# Patient Record
Sex: Male | Born: 1950 | Race: Black or African American | Hispanic: No | Marital: Single | Smoking: Current every day smoker
Health system: Southern US, Community
[De-identification: ages and names within clinical notes are randomized; demographics above are authoritative.]

## PROBLEM LIST (undated history)

## (undated) DIAGNOSIS — I10 Essential (primary) hypertension: Secondary | ICD-10-CM

---

## 2016-02-26 ENCOUNTER — Ambulatory Visit: Payer: Self-pay | Admitting: Family Medicine

## 2018-07-08 ENCOUNTER — Emergency Department (HOSPITAL_BASED_OUTPATIENT_CLINIC_OR_DEPARTMENT_OTHER): Payer: Medicare Other

## 2018-07-08 ENCOUNTER — Encounter (HOSPITAL_BASED_OUTPATIENT_CLINIC_OR_DEPARTMENT_OTHER): Payer: Self-pay | Admitting: Emergency Medicine

## 2018-07-08 ENCOUNTER — Other Ambulatory Visit: Payer: Self-pay

## 2018-07-08 ENCOUNTER — Emergency Department (HOSPITAL_BASED_OUTPATIENT_CLINIC_OR_DEPARTMENT_OTHER)
Admission: EM | Admit: 2018-07-08 | Discharge: 2018-07-08 | Disposition: A | Discharge status: Home / Self Care | Payer: Medicare Other | Attending: Emergency Medicine | Admitting: Emergency Medicine

## 2018-07-08 DIAGNOSIS — I10 Essential (primary) hypertension: Secondary | ICD-10-CM | POA: Insufficient documentation

## 2018-07-08 DIAGNOSIS — F17200 Nicotine dependence, unspecified, uncomplicated: Secondary | ICD-10-CM | POA: Diagnosis not present

## 2018-07-08 DIAGNOSIS — M79631 Pain in right forearm: Secondary | ICD-10-CM | POA: Insufficient documentation

## 2018-07-08 DIAGNOSIS — Z79899 Other long term (current) drug therapy: Secondary | ICD-10-CM | POA: Insufficient documentation

## 2018-07-08 DIAGNOSIS — M25531 Pain in right wrist: Secondary | ICD-10-CM

## 2018-07-08 HISTORY — DX: Essential (primary) hypertension: I10

## 2018-07-08 MED ORDER — NAPROXEN 500 MG PO TABS: 500.0000 mg | ORAL_TABLET | Freq: Two times a day (BID) | ORAL | 0 refills | Status: DC

## 2018-07-08 MED ORDER — HYDROCODONE-ACETAMINOPHEN 5-325 MG PO TABS
2.0000 | ORAL_TABLET | Freq: Once | ORAL | Status: AC
  Administered 2018-07-08: 2 via ORAL
  Filled 2018-07-08: qty 2

## 2018-07-08 MED ORDER — ACETAMINOPHEN 500 MG PO TABS: 500.0000 mg | ORAL_TABLET | Freq: Four times a day (QID) | ORAL | 0 refills | Status: DC | PRN

## 2018-07-08 NOTE — ED Notes (Signed)
Patient transported to X-ray 

## 2018-07-08 NOTE — Discharge Instructions (Signed)
Take Naprosyn and Tylenol as prescribed for your pain.  Do not combine Naprosyn and ibuprofen, however if you like ibuprofen better, you can replace it with Naprosyn.  Use ice 3-4 times daily alternating 20 minutes on, 20 minutes off.  Wear your wrist splint at all times except when bathing.  Try to avoid using your wrist and rest it.  Please follow-up with the sports medicine doctor, Dr. Pearletha Forge, for further evaluation and treatment of your wrist pain.  Please return the emergency department if you develop any new or worsening symptoms including extreme redness, warmth, or fever.  Please have your doctor recheck your blood pressure and continue taking her blood pressure medications, as it was elevated today, most likely related to your pain.

## 2018-07-08 NOTE — ED Provider Notes (Signed)
[facility] EMERGENCY DEPARTMENT Provider Note   CSN: [id number] Arrival date & time: 07/08/18  1629     History   Chief Complaint Chief Complaint  Patient presents with  . Arm Pain    HPI Richard Buck is a 67 y.o. male with history of hypertension who presents with right wrist and forearm pain since yesterday.  Patient denies any trauma or injury.  However, patient is a Investment banker, operational and uses repetitive motions, especially in his right hand. He is right-handed.  He applied a shea butter, CBD oil salve at home as well as taking Tylenol and ibuprofen without relief.  He denies any numbness or tingling.  Patient denies any fevers.  He has no history of gout.  He denies symptoms like this in the past. No history of IVDU.  HPI  Past Medical History:  Diagnosis Date  . Hypertension     There are no active problems to display for this patient.   History reviewed. No pertinent surgical history.      Home Medications    Prior to Admission medications   Medication Sig Start Date End Date Taking? Authorizing Provider  amLODipine (NORVASC) 10 MG tablet Take 10 mg by mouth daily.   Yes [provider]  metoprolol tartrate (LOPRESSOR) 50 MG tablet Take 50 mg by mouth 2 (two) times daily.   Yes [provider]  acetaminophen (TYLENOL) 500 MG tablet Take 1 tablet (500 mg total) by mouth every 6 (six) hours as needed. 07/08/18   Felicha Frayne, Waylan Boga, PA-C  naproxen (NAPROSYN) 500 MG tablet Take 1 tablet (500 mg total) by mouth 2 (two) times daily. 07/08/18   Emi Holes, PA-C    Family History No family history on file.  Social History Social History   Tobacco Use  . Smoking status: Current Every Day Smoker  . Smokeless tobacco: Never Used  Substance Use Topics  . Alcohol use: Not Currently  . Drug use: Yes    Types: Marijuana     Allergies   Patient has no known allergies.   Review of Systems Review of Systems  Constitutional: Negative for fever.    Musculoskeletal: Positive for arthralgias, joint swelling and myalgias.  Neurological: Negative for numbness.     Physical Exam Updated Vital Signs BP (!) 171/93 (BP Location: Left Arm)   Pulse 64   Temp 98.5 F (36.9 C) (Oral)   Resp 18   Ht 6\' 4"  (1.93 m)   Wt 127 kg   SpO2 96%   BMI 34.08 kg/m   Physical Exam  Constitutional: He appears well-developed and well-nourished. No distress.  HENT:  Head: Normocephalic and atraumatic.  Mouth/Throat: Oropharynx is clear and moist. No oropharyngeal exudate.  Eyes: Pupils are equal, round, and reactive to light. Conjunctivae are normal. Right eye exhibits no discharge. Left eye exhibits no discharge. No scleral icterus.  Neck: Normal range of motion. Neck supple. No thyromegaly present.  Cardiovascular: Normal rate, regular rhythm, normal heart sounds and intact distal pulses. Exam reveals no gallop and no friction rub.  No murmur heard. Pulmonary/Chest: Effort normal and breath sounds normal. No stridor. No respiratory distress. He has no wheezes. He has no rales.  Musculoskeletal: He exhibits no edema.  Mild edema noted to right wrist with tenderness to the radial aspect and anatomical snuffbox; as well as tenderness of the forearm in the ulnar aspect; pain with any range of motion of the wrist and pain in the wrist with flexion and  extension of the fingers; no warmth erythema noted to the wrist; no elbow or shoulder tenderness  Lymphadenopathy:    He has no cervical adenopathy.  Neurological: He is alert. Coordination normal.  Skin: Skin is warm and dry. No rash noted. He is not diaphoretic. No pallor.  Psychiatric: He has a normal mood and affect.  Nursing note and vitals reviewed.    ED Treatments / Results  Labs (all labs ordered are listed, but only abnormal results are displayed) Labs Reviewed - No data to display  EKG None  Radiology Dg Forearm Right  Result Date: 07/08/2018 CLINICAL DATA:  Acute onset of RIGHT  wrist pain and RIGHT forearm pain that began yesterday. No known injuries. EXAM: RIGHT FOREARM - 2 VIEW COMPARISON:  RIGHT wrist x-rays obtained concurrently. FINDINGS: No evidence of acute fracture involving the radius or ulna. No intrinsic osseous abnormality. Visualized elbow joint intact. IMPRESSION: Normal examination. Electronically Signed   By: Hulan Saas M.D.   On: 07/08/2018 17:35   Dg Wrist Complete Right  Result Date: 07/08/2018 CLINICAL DATA:  Acute onset of pain involving the RIGHT wrist and RIGHT forearm that began yesterday. No known injury. EXAM: RIGHT WRIST - COMPLETE 3+ VIEW COMPARISON:  None. FINDINGS: Mild dorsal soft tissue swelling. No evidence of acute fracture or dislocation. Joint spaces well preserved. Well-preserved bone mineral density. No intrinsic osseous abnormalities. IMPRESSION: No osseous abnormality. Electronically Signed   By: Hulan Saas M.D.   On: 07/08/2018 17:35    Procedures Procedures (including critical care time)  Medications Ordered in ED Medications  HYDROcodone-acetaminophen (NORCO/VICODIN) 5-325 MG per tablet 2 tablet (2 tablets Oral Given 07/08/18 1649)     Initial Impression / Assessment and Plan / ED Course  I have reviewed the triage vital signs and the nursing notes.  Pertinent labs & imaging results that were available during my care of the patient were reviewed by me and considered in my medical decision making (see chart for details).     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  Low suspicion of septic joint, no warmth, erythema, or fever.  X-ray of the right forearm and wrist are negative.  We will treat with NSAIDs, Tylenol, ice, and thumb spica splint.  Patient will be advised to follow-up with sports medicine for further evaluation and treatment.  Return precautions discussed.  Patient understands and agrees with plan.  Patient vitals stable throughout ED course and discharged in  satisfactory condition.  I discussed patient case with Dr. Rush Landmark who guided the patient's management and agrees with plan.   Final Clinical Impressions(s) / ED Diagnoses   Final diagnoses:  Right wrist pain    ED Discharge Orders         Ordered    naproxen (NAPROSYN) 500 MG tablet  2 times daily     07/08/18 1801    acetaminophen (TYLENOL) 500 MG tablet  Every 6 hours PRN     07/08/18 1801           Emi Holes, PA-C 07/08/18 1804    Tegeler, Canary Brim, MD 07/09/18 0040

## 2018-07-08 NOTE — ED Notes (Signed)
ED Provider at bedside. 

## 2018-07-08 NOTE — ED Triage Notes (Signed)
R arm pain from the wrist to the elbow since yesterday. Denies injury.

## 2019-02-13 ENCOUNTER — Encounter: Payer: Self-pay | Admitting: Gastroenterology

## 2019-04-08 ENCOUNTER — Encounter: Payer: Self-pay | Admitting: Gastroenterology

## 2019-04-25 ENCOUNTER — Ambulatory Visit: Payer: Medicare Other

## 2019-07-16 ENCOUNTER — Ambulatory Visit: Payer: Medicare Other

## 2019-07-23 ENCOUNTER — Other Ambulatory Visit: Payer: Self-pay

## 2019-07-23 ENCOUNTER — Ambulatory Visit (INDEPENDENT_AMBULATORY_CARE_PROVIDER_SITE_OTHER): Payer: Self-pay | Admitting: *Deleted

## 2019-07-23 DIAGNOSIS — Z1211 Encounter for screening for malignant neoplasm of colon: Secondary | ICD-10-CM

## 2019-07-23 MED ORDER — NA SULFATE-K SULFATE-MG SULF 17.5-3.13-1.6 GM/177ML PO SOLN: 1.0000 | Freq: Once | ORAL | 0 refills | Status: AC

## 2019-07-23 NOTE — Progress Notes (Addendum)
Gastroenterology Pre-Procedure Review  Request Date: 07/23/2019 Requesting Physician: Ferdinand Lango, NP @ [facility], no previous TCS  PATIENT REVIEW QUESTIONS: The patient responded to the following health history questions as indicated:    1. Diabetes Melitis: No 2. Joint replacements in the past 12 months: No 3. Major health problems in the past 3 months: No 4. Has an artificial valve or MVP: No 5. Has a defibrillator: No 6. Has been advised in past to take antibiotics in advance of a procedure like teeth cleaning: No 7. Family history of colon cancer: No  8. Alcohol Use: No 9. History of sleep apnea: No  10. History of coronary artery or other vascular stents placed within the last 12 months: No 11. History of any prior anesthesia complications: No    MEDICATIONS & ALLERGIES:    Patient reports the following regarding taking any blood thinners:   Plavix? No Aspirin? No Coumadin? No Brilinta? No Xarelto? No Eliquis? No Pradaxa? No Savaysa? No Effient? No  Patient confirms/reports the following medications:  Current Outpatient Medications  Medication Sig Dispense Refill  . amLODipine (NORVASC) 10 MG tablet Take 10 mg by mouth daily.    . metoprolol tartrate (LOPRESSOR) 50 MG tablet Take 50 mg by mouth 2 (two) times daily.    . sildenafil (VIAGRA) 50 MG tablet Take 50 mg by mouth as needed for erectile dysfunction.     No current facility-administered medications for this visit.     Patient confirms/reports the following allergies:  No Known Allergies  No orders of the defined types were placed in this encounter.   AUTHORIZATION INFORMATION Primary Insurance: Ivery Quale,  ID #: [id number],  Group #: [id number] Pre-Cert / Josem Kaufmann required:  Pre-Cert / Auth #:  SCHEDULE INFORMATION: Procedure has been scheduled as follows:  Date: 09/20/2019, Time: 10:30 Location: [facility] with Dr. Oneida Alar  This Gastroenterology Pre-Precedure Review Form is being routed to the  following provider(s): Neil Crouch, PA-C

## 2019-07-23 NOTE — Patient Instructions (Signed)
Richard Buck  May 24, 1951 MRN: [id number]     Procedure Date: 09/20/2019 Time to register: 9:30 am Place to register: [facility] Procedure Time: 10:30 am Scheduled provider: Dr. Oneida Alar    PREPARATION FOR COLONOSCOPY WITH SUPREP BOWEL PREP KIT  Note: Suprep Bowel Prep Kit is a split-dose (2day) regimen. Consumption of BOTH 6-ounce bottles is required for a complete prep.  Please notify us immediately if you are diabetic, take iron supplements, or if you are on Coumadin or any other blood thinners.                                                                                                                                                 1 DAY BEFORE PROCEDURE:  DATE: 09/19/2019   DAY: Thursday Continue clear liquids the entire day - NO SOLID FOOD.    At 6:00pm: Complete steps 1 through 4 below, using ONE (1) 6-ounce bottle, before going to bed. Step 1:  Pour ONE (1) 6-ounce bottle of SUPREP liquid into the mixing container.  Step 2:  Add cool drinking water to the 16 ounce line on the container and mix.  Note: Dilute the solution concentrate as directed prior to use. Step 3:  DRINK ALL the liquid in the container. Step 4:  You MUST drink an additional two (2) or more 16 ounce containers of water over the next one (1) hour.   Continue clear liquids.  DAY OF PROCEDURE:   DATE: 09/20/2019   DAY: Friday If you take medications for your heart, blood pressure, or breathing, you may take these medications.   5 hours before your procedure at:  5:30 am Step 1:  Pour ONE (1) 6-ounce bottle of SUPREP liquid into the mixing container.  Step 2:  Add cool drinking water to the 16 ounce line on the container and mix.  Note: Dilute the solution concentrate as directed prior to use. Step 3:  DRINK ALL the liquid in the container. Step 4:  You MUST drink an additional two (2) or more 16 ounce containers of water over the next one (1) hour. You MUST complete the final glass of  water at least 3 hours before your colonoscopy. Nothing by mouth past 7:30 am  You may take your morning medications with sip of water unless we have instructed otherwise.    Please see below for Dietary Information.  CLEAR LIQUIDS INCLUDE:  Water Jello (NOT red in color)   Ice Popsicles (NOT red in color)   Tea (sugar ok, no milk/cream) Powdered fruit flavored drinks  Coffee (sugar ok, no milk/cream) Gatorade/ Lemonade/ Kool-Aid  (NOT red in color)   Juice: apple, white grape, white cranberry Soft drinks  Clear bullion, consomme, broth (fat free beef/chicken/vegetable)  Carbonated beverages (any kind)  Strained chicken noodle soup Hard Candy   Remember: Clear liquids  are liquids that will allow you to see your fingers on the other side of a clear glass. Be sure liquids are NOT red in color, and not cloudy, but CLEAR.  DO NOT EAT OR DRINK ANY OF THE FOLLOWING:  Dairy products of any kind   Cranberry juice Tomato juice / V8 juice   Grapefruit juice Orange juice     Red grape juice  Do not eat any solid foods, including such foods as: cereal, oatmeal, yogurt, fruits, vegetables, creamed soups, eggs, bread, crackers, pureed foods in a blender, etc.   HELPFUL HINTS FOR DRINKING PREP SOLUTION:   Make sure prep is extremely cold. Mix and refrigerate the the morning of the prep. You may also put in the freezer.   You may try mixing some Crystal Light or Country Time Lemonade if you prefer. Mix in small amounts; add more if necessary.  Try drinking through a straw  Rinse mouth with water or a mouthwash between glasses, to remove after-taste.  Try sipping on a cold beverage /ice/ popsicles between glasses of prep.  Place a piece of sugar-free hard candy in mouth between glasses.  If you become nauseated, try consuming smaller amounts, or stretch out the time between glasses. Stop for 30-60 minutes, then slowly start back drinking.     OTHER INSTRUCTIONS  You will need a  responsible adult at least 66 years of age to accompany you and drive you home. This person must remain in the waiting room during your procedure. The hospital will cancel your procedure if you do not have a responsible adult with you.   1. Wear loose fitting clothing that is easily removed. 2. Leave jewelry and other valuables at home.  3. Remove all body piercing jewelry and leave at home. 4. Total time from sign-in until discharge is approximately 2-3 hours. 5. You should go home directly after your procedure and rest. You can resume normal activities the day after your procedure. 6. The day of your procedure you should not:  Drive  Make legal decisions  Operate machinery  Drink alcohol  Return to work   You may call the office (Dept: [phone]) before 5:00pm, or page the doctor on call [phone]) after 5:00pm, for further instructions, if necessary.   Insurance Information YOU WILL NEED TO CHECK WITH YOUR INSURANCE COMPANY FOR THE BENEFITS OF COVERAGE YOU HAVE FOR THIS PROCEDURE.  UNFORTUNATELY, NOT ALL INSURANCE COMPANIES HAVE BENEFITS TO COVER ALL OR PART OF THESE TYPES OF PROCEDURES.  IT IS YOUR RESPONSIBILITY TO CHECK YOUR BENEFITS, HOWEVER, WE WILL BE GLAD TO ASSIST YOU WITH ANY CODES YOUR INSURANCE COMPANY MAY NEED.    PLEASE NOTE THAT MOST INSURANCE COMPANIES WILL NOT COVER A SCREENING COLONOSCOPY FOR PEOPLE UNDER THE AGE OF 50  IF YOU HAVE BCBS INSURANCE, YOU MAY HAVE BENEFITS FOR A SCREENING COLONOSCOPY BUT IF POLYPS ARE FOUND THE DIAGNOSIS WILL CHANGE AND THEN YOU MAY HAVE A DEDUCTIBLE THAT WILL NEED TO BE MET. SO PLEASE MAKE SURE YOU CHECK YOUR BENEFITS FOR A SCREENING COLONOSCOPY AS WELL AS A DIAGNOSTIC COLONOSCOPY.

## 2019-07-23 NOTE — Progress Notes (Signed)
okk to schedule

## 2019-07-24 NOTE — Addendum Note (Signed)
Addended by: Metro Kung on: 07/24/2019 07:39 AM   Modules accepted: Orders, SmartSet

## 2019-07-29 NOTE — Progress Notes (Signed)
Called pt and informed him that [employer] told me that we were out of network and that his procedure would be an out of network expense.  Pt said not to cancel his procedure yet because he is going to contact [facility].  Pt to call us back.

## 2019-07-30 ENCOUNTER — Telehealth: Payer: Self-pay | Admitting: *Deleted

## 2019-07-30 NOTE — Telephone Encounter (Addendum)
Spoke with pt today and he requested [place] to cancel his procedure.  He is going to try to get another insurance.  His current insurance is out of network per [employer] at [place].  Pt will call us back in the future. Endo notified.

## 2019-07-31 NOTE — Telephone Encounter (Signed)
noted 

## 2019-09-18 ENCOUNTER — Other Ambulatory Visit (HOSPITAL_COMMUNITY): Payer: Medicare Other

## 2019-09-20 ENCOUNTER — Encounter (HOSPITAL_COMMUNITY): Payer: Self-pay

## 2019-09-20 ENCOUNTER — Ambulatory Visit (HOSPITAL_COMMUNITY): Admit: 2019-09-20 | Payer: Medicare Other | Admitting: Gastroenterology

## 2019-09-20 SURGERY — COLONOSCOPY
Anesthesia: Moderate Sedation

## 2019-09-30 IMAGING — DX DG WRIST COMPLETE 3+V*R*
4 series · 4 of 4 positions shown · non-contrast
Comparison: None.

CLINICAL DATA: Acute onset of pain involving the RIGHT wrist and
RIGHT forearm that began yesterday. No known injury.

EXAM:
RIGHT WRIST - COMPLETE 3+ VIEW

[wrist pa]
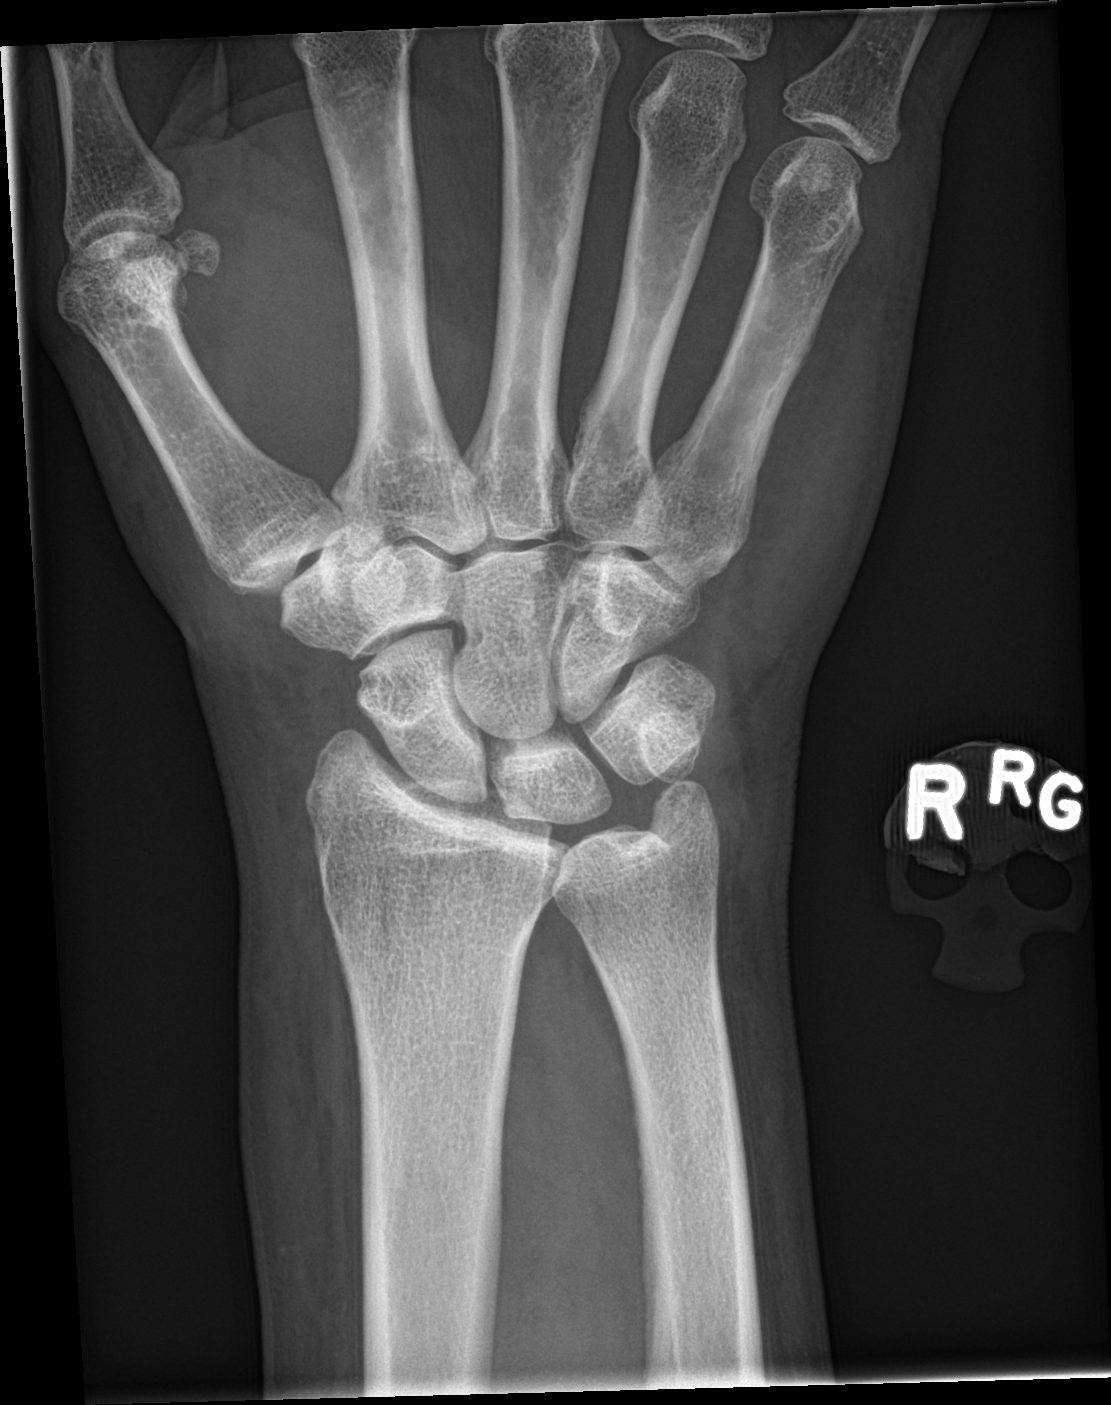

[wrist obl]
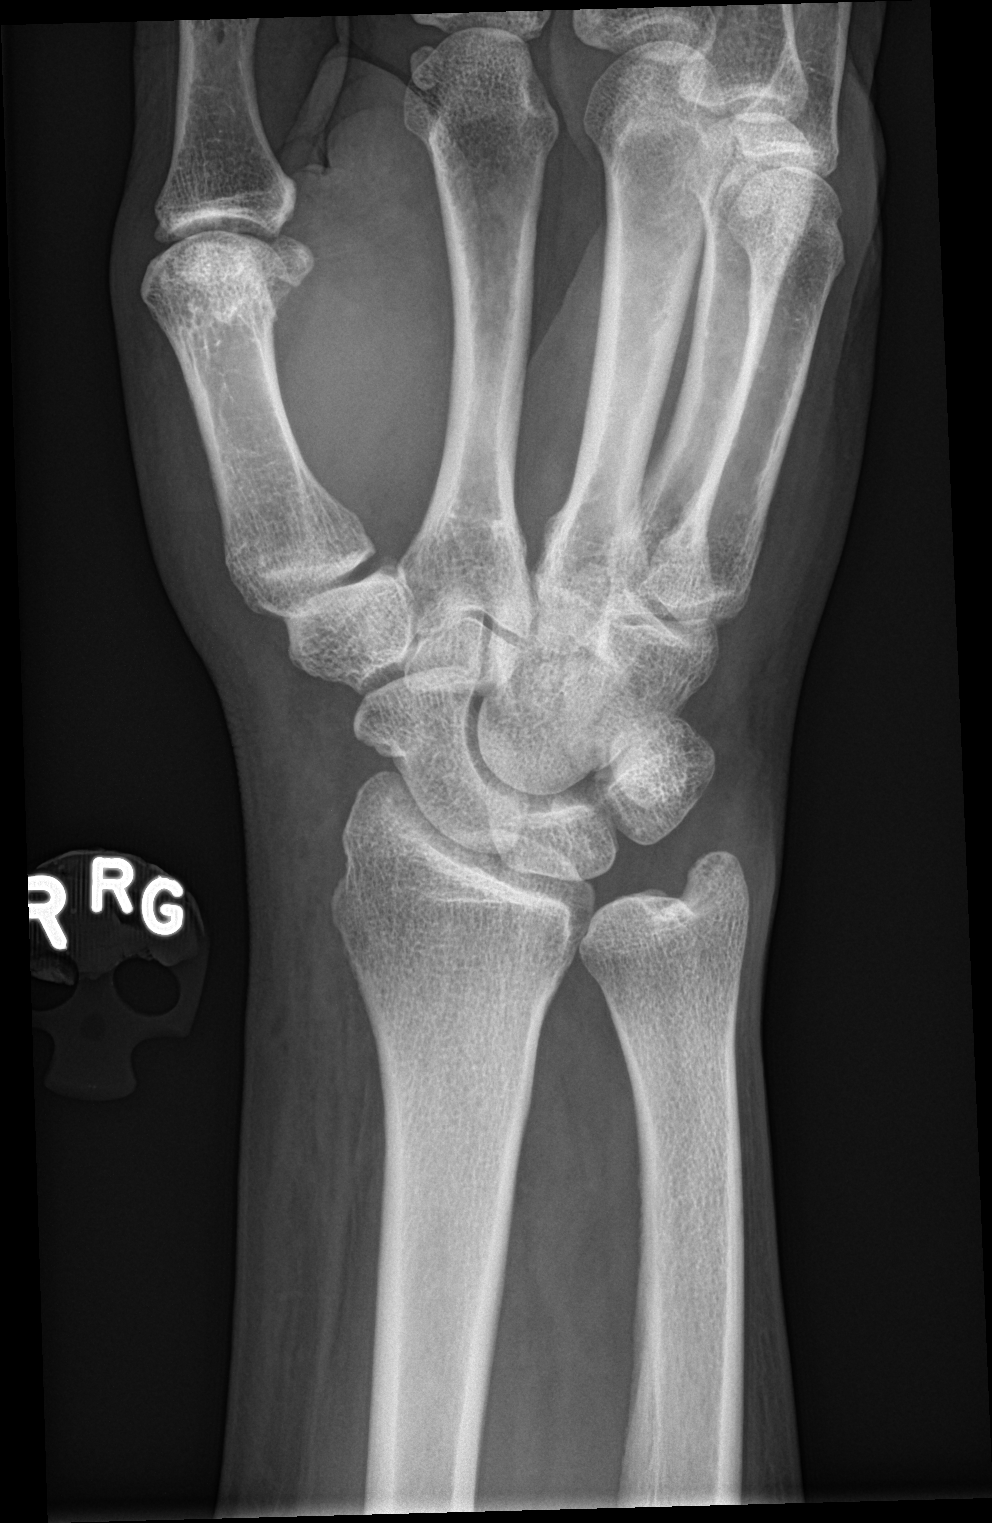

[wrist lat]
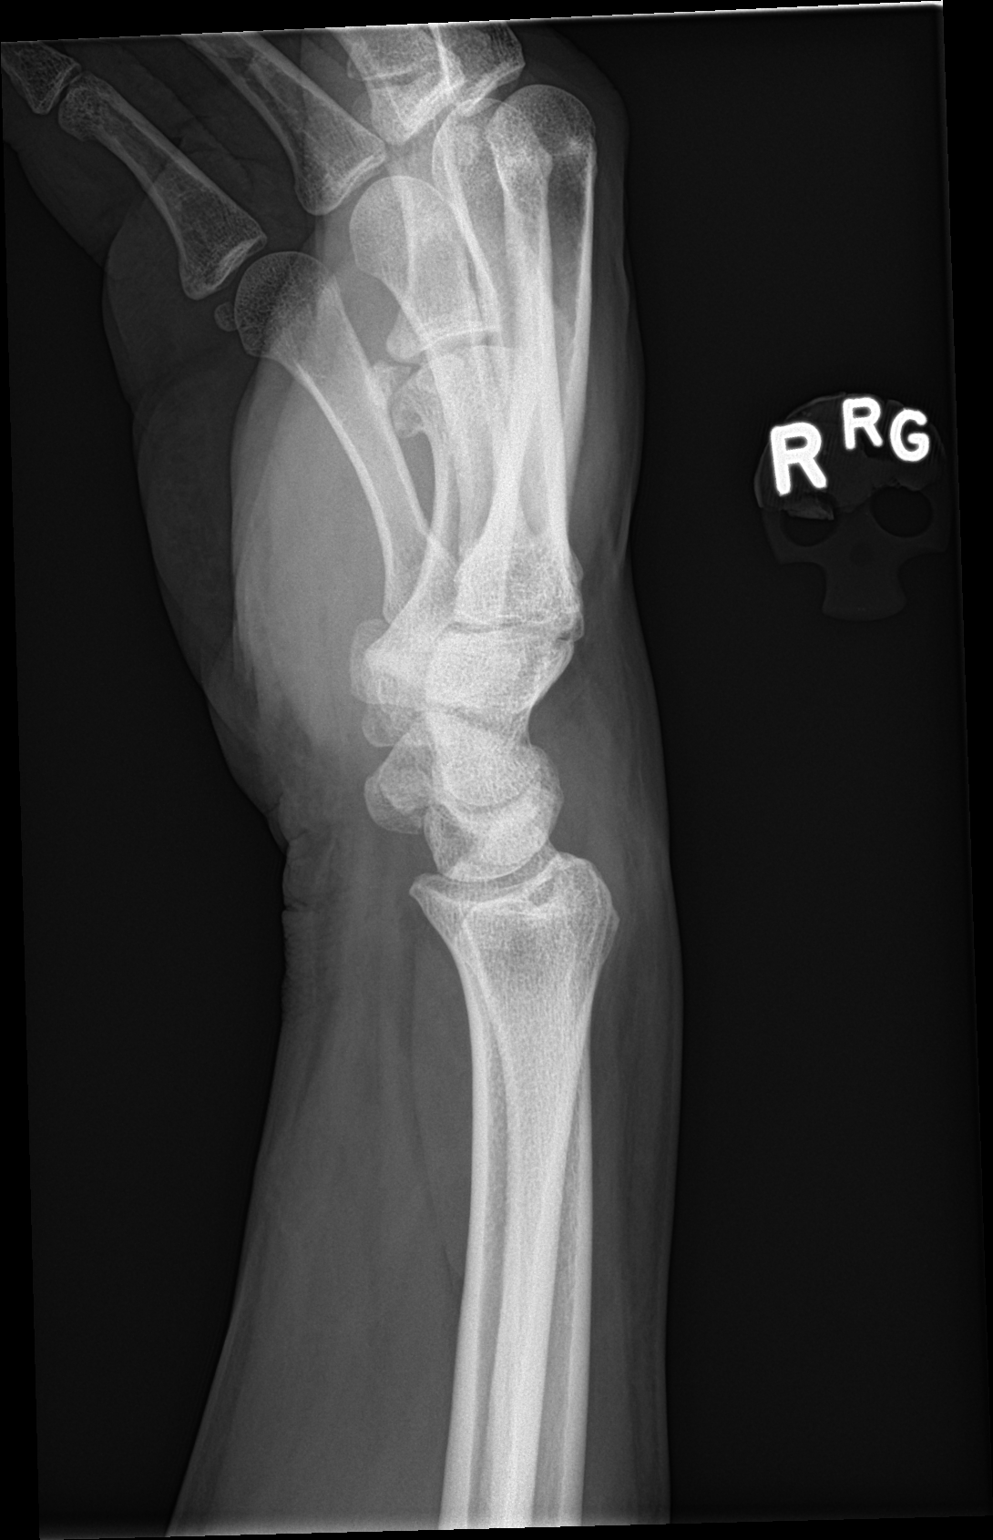

[wrist navicular]
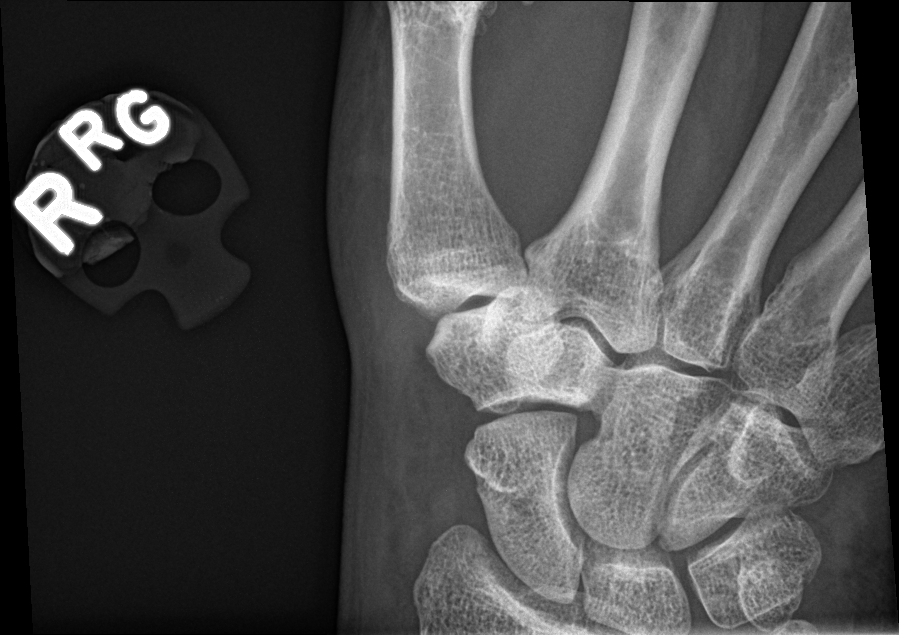

[4 of 4 positions shown; findings below may reference images not displayed]

FINDINGS: Mild dorsal soft tissue swelling. No evidence of acute fracture or
dislocation. Joint spaces well preserved. Well-preserved bone
mineral density. No intrinsic osseous abnormalities.
IMPRESSION: No osseous abnormality.

## 2019-09-30 IMAGING — DX DG FOREARM 2V*R*
2 series · 2 of 2 positions shown · non-contrast
Comparison: RIGHT wrist x-rays obtained concurrently.

CLINICAL DATA: Acute onset of RIGHT wrist pain and RIGHT forearm
pain that began yesterday. No known injuries.

EXAM:
RIGHT FOREARM - 2 VIEW

[forearm ap]
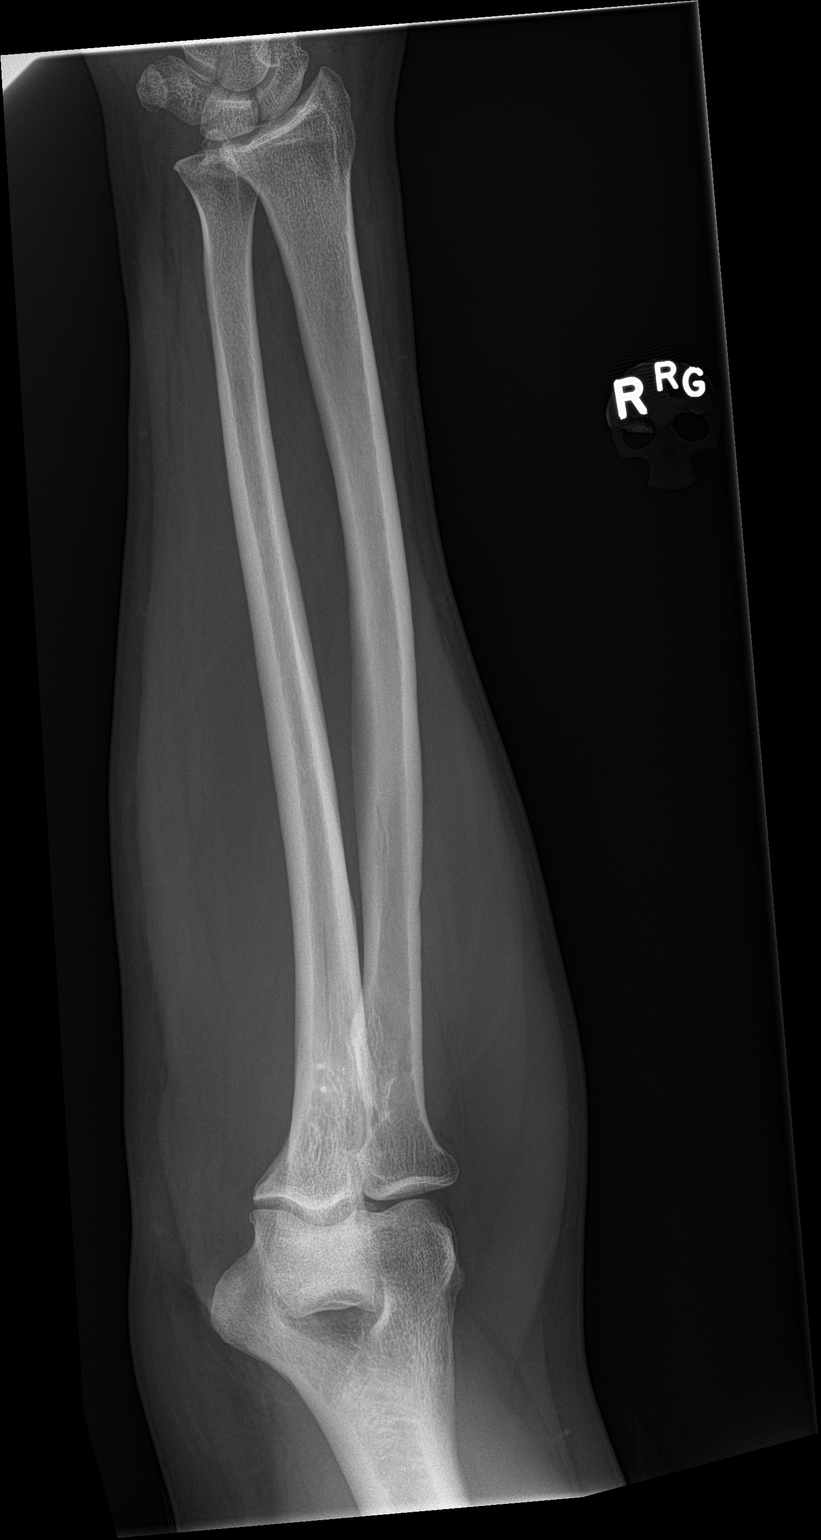

[forearm lat]
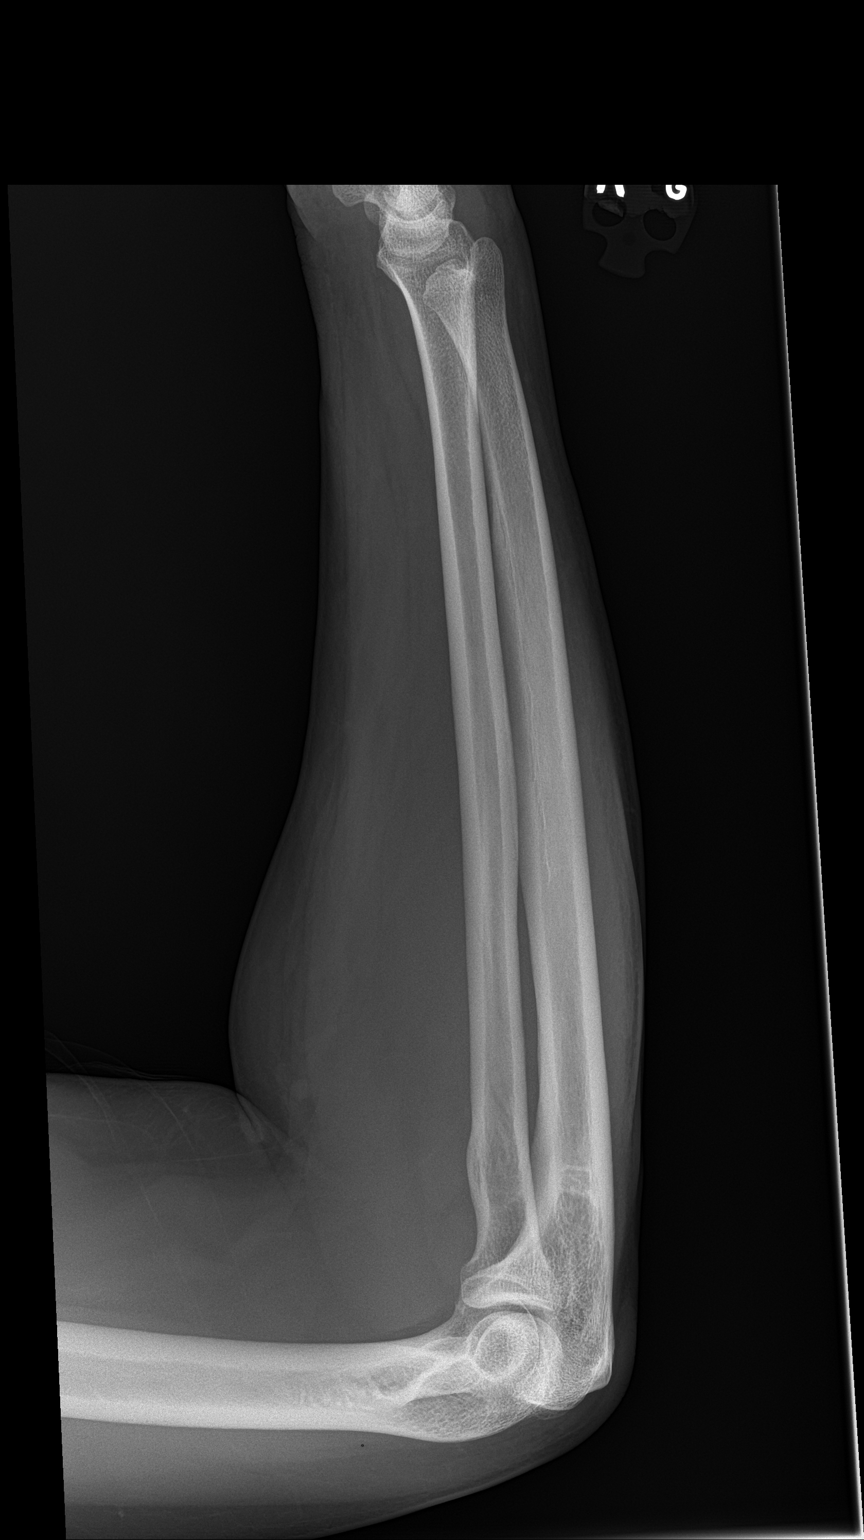

[2 of 2 positions shown; findings below may reference images not displayed]

FINDINGS: No evidence of acute fracture involving the radius or ulna. No
intrinsic osseous abnormality. Visualized elbow joint intact.
IMPRESSION: Normal examination.

## 2019-12-01 ENCOUNTER — Telehealth: Payer: Self-pay | Admitting: Urology

## 2019-12-01 MED ORDER — LEVOFLOXACIN 500 MG PO TABS: 500.0000 mg | ORAL_TABLET | Freq: Every day | ORAL | 0 refills | Status: AC

## 2019-12-01 MED ORDER — FLEET PEDIATRIC 3.5-9.5 GM/59ML RE ENEM: 1.0000 | ENEMA | Freq: Once | RECTAL | 0 refills | Status: AC

## 2019-12-01 NOTE — Telephone Encounter (Signed)
Prostate biopsy meds sent

## 2019-12-01 NOTE — Telephone Encounter (Signed)
Error

## 2020-03-19 ENCOUNTER — Emergency Department (HOSPITAL_COMMUNITY): Payer: Medicare Other

## 2020-03-19 ENCOUNTER — Other Ambulatory Visit: Payer: Self-pay

## 2020-03-19 ENCOUNTER — Emergency Department (HOSPITAL_COMMUNITY)
Admission: EM | Admit: 2020-03-19 | Discharge: 2020-03-19 | Disposition: A | Discharge status: Home / Self Care | Payer: Medicare Other | Attending: Emergency Medicine | Admitting: Emergency Medicine

## 2020-03-19 ENCOUNTER — Encounter (HOSPITAL_COMMUNITY): Payer: Self-pay | Admitting: Emergency Medicine

## 2020-03-19 DIAGNOSIS — Z79899 Other long term (current) drug therapy: Secondary | ICD-10-CM | POA: Insufficient documentation

## 2020-03-19 DIAGNOSIS — F17210 Nicotine dependence, cigarettes, uncomplicated: Secondary | ICD-10-CM | POA: Diagnosis not present

## 2020-03-19 DIAGNOSIS — I10 Essential (primary) hypertension: Secondary | ICD-10-CM | POA: Diagnosis not present

## 2020-03-19 DIAGNOSIS — M25572 Pain in left ankle and joints of left foot: Secondary | ICD-10-CM | POA: Diagnosis present

## 2020-03-19 MED ORDER — HYDROCODONE-ACETAMINOPHEN 5-325 MG PO TABS: 1.0000 | ORAL_TABLET | Freq: Four times a day (QID) | ORAL | 0 refills | Status: DC | PRN

## 2020-03-19 MED ORDER — PREDNISONE 10 MG (21) PO TBPK: ORAL_TABLET | Freq: Every day | ORAL | 0 refills | Status: DC

## 2020-03-19 MED ORDER — HYDROCODONE-ACETAMINOPHEN 5-325 MG PO TABS
1.0000 | ORAL_TABLET | Freq: Once | ORAL | Status: AC
  Administered 2020-03-19: 1 via ORAL
  Filled 2020-03-19: qty 1

## 2020-03-19 NOTE — Discharge Instructions (Addendum)
Take prednisone as directed. Prescription given for Norco. Take medication as directed and do not operate machinery, drive a car, or work while taking this medication as it can make you drowsy.    Please follow up with your primary care provider within 5-7 days for re-evaluation of your symptoms. If you do not have a primary care provider, information for a healthcare clinic has been provided for you to make arrangements for follow up care. Please return to the emergency department for any new or worsening symptoms.

## 2020-03-19 NOTE — ED Provider Notes (Signed)
Medical screening examination/treatment/procedure(s) were conducted as a shared visit with non-physician practitioner(s) and myself.  I personally evaluated the patient during the encounter.     Patient seen by me patient with complaint of left ankle pain started last evening.  Patient's had a history of gout in the past.  X-rays are negative.  The ankle has some redness medially with some swelling increased warmth seems to be consistent with gout.  We will treat with prednisone and a short course of pain medicine.  Good cap refill to the toes.   Vanetta Mulders, MD 03/19/20 1350

## 2020-03-19 NOTE — ED Notes (Signed)
Patient given discharge instructions. Questions were answered. Patient verbalized understanding of discharge instructions and care at home.  

## 2020-03-19 NOTE — ED Triage Notes (Signed)
Pt reports pain, redness and warmth to R ankle that began last night, hx of gout and feels this is a flare. Not currently on any antigout meds

## 2020-03-19 NOTE — ED Provider Notes (Signed)
[facility] EMERGENCY DEPARTMENT Provider Note   CSN: [id number] Arrival date & time: 03/19/20  1132     History Chief Complaint  Patient presents with   Gout    Richard Buck. is a 70 y.o. male.  HPI     69 year old male with a history of hypertension and gout presenting for evaluation of left ankle pain.  States he woke up this morning with left ankle pain.  It hurts to touch and is worse with movement.  He has some swelling to the ankle as well.  He rates pain 10/10 and states that it is constant.  He states it feels similar to when he had gout last.  He does admit to eating red meat and fish recently.  He denies consumption of any beer.  He denies any falls or trauma  He has tried Tylenol without any significant relief.   Past Medical History:  Diagnosis Date   Hypertension     There are no problems to display for this patient.   History reviewed. No pertinent surgical history.     No family history on file.  Social History   Tobacco Use   Smoking status: Current Every Day Smoker    Packs/day: 0.10   Smokeless tobacco: Never Used   Tobacco comment: 2 cigarettes a day  Substance Use Topics   Alcohol use: Not Currently   Drug use: Not Currently    Types: Marijuana    Home Medications Prior to Admission medications   Medication Sig Start Date End Date Taking? Authorizing Provider  amLODipine (NORVASC) 10 MG tablet Take 10 mg by mouth daily.    [provider]  HYDROcodone-acetaminophen (NORCO/VICODIN) 5-325 MG tablet Take 1 tablet by mouth every 6 (six) hours as needed. 03/19/20   Dywane Peruski S, PA-C  metoprolol tartrate (LOPRESSOR) 50 MG tablet Take 50 mg by mouth 2 (two) times daily.    [provider]  predniSONE (STERAPRED UNI-PAK 21 TAB) 10 MG (21) TBPK tablet Take by mouth daily. Take 6 tabs by mouth daily  for 2 days, then 5 tabs for 2 days, then 4 tabs for 2 days, then 3 tabs for 2 days, 2 tabs for 2  days, then 1 tab by mouth daily for 2 days 03/19/20   Penni Penado S, PA-C  sildenafil (VIAGRA) 50 MG tablet Take 50 mg by mouth as needed for erectile dysfunction.    [provider]    Allergies    Patient has no known allergies.  Review of Systems   Review of Systems  Constitutional: Negative for fever.  Musculoskeletal:       Left ankle pain/swelling  Skin: Positive for color change.    Physical Exam Updated Vital Signs BP (!) 153/81 (BP Location: Right Arm)    Pulse 65    Temp 99.6 F (37.6 C) (Oral)    Resp 18    Ht 6\' 4"  (1.93 m)    Wt (!) 140.6 kg    SpO2 98%    BMI 37.73 kg/m   Physical Exam Constitutional:      General: He is not in acute distress.    Appearance: He is well-developed.  Eyes:     Conjunctiva/sclera: Conjunctivae normal.  Cardiovascular:     Rate and Rhythm: Normal rate.  Pulmonary:     Effort: Pulmonary effort is normal.  Musculoskeletal:     Comments: TTP to the medial/lateral malleolus with warmth and some erythema. He is able  to range his ankle joint. Pulses are intact and symmetric bilat.   Skin:    General: Skin is warm and dry.  Neurological:     Mental Status: He is alert and oriented to person, place, and time.     ED Results / Procedures / Treatments   Labs (all labs ordered are listed, but only abnormal results are displayed) Labs Reviewed - No data to display  EKG None  Radiology DG Ankle Complete Left  Result Date: 03/19/2020 CLINICAL DATA:  Left ankle pain and redness.  History of gout EXAM: LEFT ANKLE COMPLETE - 3+ VIEW COMPARISON:  None. FINDINGS: There is no evidence of fracture, dislocation, or joint effusion. No bone erosion. Tiny plantar calcaneal spur. There is no evidence of arthropathy or other focal bone abnormality. Soft tissues are unremarkable. IMPRESSION: Negative. Electronically Signed   By: Duanne Guess D.O.   On: 03/19/2020 12:56    Procedures Procedures (including critical care  time)  Medications Ordered in ED Medications  HYDROcodone-acetaminophen (NORCO/VICODIN) 5-325 MG per tablet 1 tablet (1 tablet Oral Given 03/19/20 1226)    ED Course  I have reviewed the triage vital signs and the nursing notes.  Pertinent labs & imaging results that were available during my care of the patient were reviewed by me and considered in my medical decision making (see chart for details).    MDM Rules/Calculators/A&P                      Pt presents with monoarticular pain, swelling and erythema.  Pt is afebrile and stable. Imaging reviewed, no evidence of occult fracture or injury.  Exam is consistent with gout.  Patient has history of same and does admit to eating fish and red meat recently which is likely the cause of his symptoms.  Have lower suspicion for septic arthritis at this time.  Will DC with prednisone and pain medications.  Discussed that pt should respond to treatment with in 24 hour of begining treatment & likely resolve in 2-3 days.  I advised that if his symptoms do not improve in the next 24 to 48 hours or if they worsen he should return to the ED, otherwise he should follow-up with his PCP.  He voiced understanding of plan and reasons to return.  All questions answered.  Patient stable for discharge.  Case discussed with Dr. Deretha Emory who personally evaluated the patient and is in agreement with the plan.    Final Clinical Impression(s) / ED Diagnoses Final diagnoses:  Acute left ankle pain    Rx / DC Orders ED Discharge Orders         Ordered    predniSONE (STERAPRED UNI-PAK 21 TAB) 10 MG (21) TBPK tablet  Daily,   Status:  Discontinued     03/19/20 1332    predniSONE (STERAPRED UNI-PAK 21 TAB) 10 MG (21) TBPK tablet  Daily     03/19/20 1356    HYDROcodone-acetaminophen (NORCO/VICODIN) 5-325 MG tablet  Every 6 hours PRN     03/19/20 1356           Kyreese Chio S, PA-C 03/19/20 1608    Vanetta Mulders, MD 03/20/20 [phone]

## 2021-05-18 ENCOUNTER — Encounter: Payer: Self-pay | Admitting: Internal Medicine

## 2021-07-22 ENCOUNTER — Ambulatory Visit (INDEPENDENT_AMBULATORY_CARE_PROVIDER_SITE_OTHER): Payer: Self-pay | Admitting: *Deleted

## 2021-07-22 VITALS — Ht 76.0 in | Wt 307.0 lb

## 2021-07-22 DIAGNOSIS — Z1211 Encounter for screening for malignant neoplasm of colon: Secondary | ICD-10-CM

## 2021-07-22 MED ORDER — NA SULFATE-K SULFATE-MG SULF 17.5-3.13-1.6 GM/177ML PO SOLN: 1.0000 | Freq: Once | ORAL | 0 refills | Status: AC

## 2021-07-22 NOTE — Progress Notes (Signed)
Okay to schedule. ASA 2 

## 2021-07-22 NOTE — Patient Instructions (Signed)
Richard M Tenpenny Jr.  10/20/1951 MRN: [id number]     Procedure Date: 07/30/2021 Time to register: 11:15 am Place to register: [facility] Scheduled provider: Dr. Carver    PREPARATION FOR COLONOSCOPY WITH SUPREP BOWEL PREP KIT  Note: Suprep Bowel Prep Kit is a split-dose (2day) regimen. Consumption of BOTH 6-ounce bottles is required for a complete prep.  Please notify us immediately if you are diabetic, take iron supplements, or if you are on Coumadin or any other blood thinners.  Weight loss medications must be held 7 days prior to your procedure.   Please notify us of any medication changes at least 7 days prior to your procedure.   Please hold the following medications: n/a                                                                                                                                                  2 DAYS BEFORE PROCEDURE:  DATE: 07/28/2021   DAY: Wednesday Begin clear liquid diet AFTER your lunch meal. NO SOLID FOODS after this point.  1 DAY BEFORE PROCEDURE:  DATE: 07/29/2021   DAY: Thursday Continue clear liquids the entire day - NO SOLID FOOD.   Diabetic medications adjustments for today: n/a  At 6:00pm: Complete steps 1 through 4 below, using ONE (1) 6-ounce bottle, before going to bed. Step 1:  Pour ONE (1) 6-ounce bottle of SUPREP liquid into the mixing container.  Step 2:  Add cool drinking water to the 16 ounce line on the container and mix.  Note: Dilute the solution concentrate as directed prior to use. Step 3:  DRINK ALL the liquid in the container. Step 4:  You MUST drink an additional two (2) or more 16 ounce containers of water over the next one (1) hour.   Continue clear liquids.  DAY OF PROCEDURE:   DATE: 07/30/2021   DAY: Friday If you take medications for your heart, blood pressure, or breathing, you may take these medications.  Diabetic medications adjustments for today: n/a  5 hours before your procedure at 7:45 am: Step 1:   Pour ONE (1) 6-ounce bottle of SUPREP liquid into the mixing container.  Step 2:  Add cool drinking water to the 16 ounce line on the container and mix.  Note: Dilute the solution concentrate as directed prior to use. Step 3:  DRINK ALL the liquid in the container. Step 4:  You MUST drink an additional two (2) or more 16 ounce containers of water over the next one (1) hour. You MUST complete the final glass of water at least 3 hours before your colonoscopy. Nothing by mouth past 9:45 am.  You may take your morning medications with sip of water unless we have instructed otherwise.    Please see below for Dietary Information.  CLEAR LIQUIDS INCLUDE:  Water Jello (  NOT red in color)   Ice Popsicles (NOT red in color)   Tea (sugar ok, no milk/cream) Powdered fruit flavored drinks  Coffee (sugar ok, no milk/cream) Gatorade/ Lemonade/ Kool-Aid  (NOT red in color)   Juice: apple, white grape, white cranberry Soft drinks  Clear bullion, consomme, broth (fat free beef/chicken/vegetable)  Carbonated beverages (any kind)  Strained chicken noodle soup Hard Candy   Remember: Clear liquids are liquids that will allow you to see your fingers on the other side of a clear glass. Be sure liquids are NOT red in color, and not cloudy, but CLEAR.  DO NOT EAT OR DRINK ANY OF THE FOLLOWING:  Dairy products of any kind   Cranberry juice Tomato juice / V8 juice   Grapefruit juice Orange juice     Red grape juice  Do not eat any solid foods, including such foods as: cereal, oatmeal, yogurt, fruits, vegetables, creamed soups, eggs, bread, crackers, pureed foods in a blender, etc.   HELPFUL HINTS FOR DRINKING PREP SOLUTION:  Make sure prep is extremely cold. Mix and refrigerate the the morning of the prep. You may also put in the freezer.  You may try mixing some Crystal Light or Country Time Lemonade if you prefer. Mix in small amounts; add more if necessary. Try drinking through a straw Rinse mouth with  water or a mouthwash between glasses, to remove after-taste. Try sipping on a cold beverage /ice/ popsicles between glasses of prep. Place a piece of sugar-free hard candy in mouth between glasses. If you become nauseated, try consuming smaller amounts, or stretch out the time between glasses. Stop for 30-60 minutes, then slowly start back drinking.     OTHER INSTRUCTIONS  You will need to have a responsible adult immediately available after your procedure to receive discharge instructions then drive you home. We strongly encourage your responsible adult to remain in the hospital during your procedure.  Your procedure will be canceled if a responsible adult is not available.  Wear loose fitting clothing that is easily removed. Leave jewelry and other valuables at home.  Remove all body piercing jewelry and leave at home. Total time from sign-in until discharge is approximately 2-3 hours. You should go home directly after your procedure and rest. You can resume normal activities the day after your procedure. The day of your procedure you should not: Drive Make legal decisions Operate machinery Drink alcohol Return to work   You may call the office (Dept: [phone]) before 5:00pm, or page the doctor on call ([phone]) after 5:00pm, for further instructions, if necessary.   Insurance Information YOU WILL NEED TO CHECK WITH YOUR INSURANCE COMPANY FOR THE BENEFITS OF COVERAGE YOU HAVE FOR THIS PROCEDURE.  UNFORTUNATELY, NOT ALL INSURANCE COMPANIES HAVE BENEFITS TO COVER ALL OR PART OF THESE TYPES OF PROCEDURES.  IT IS YOUR RESPONSIBILITY TO CHECK YOUR BENEFITS, HOWEVER, WE WILL BE GLAD TO ASSIST YOU WITH ANY CODES YOUR INSURANCE COMPANY MAY NEED.    PLEASE NOTE THAT MOST INSURANCE COMPANIES WILL NOT COVER A SCREENING COLONOSCOPY FOR PEOPLE UNDER THE AGE OF 50  IF YOU HAVE BCBS INSURANCE, YOU MAY HAVE BENEFITS FOR A SCREENING COLONOSCOPY BUT IF POLYPS ARE FOUND THE DIAGNOSIS WILL  CHANGE AND THEN YOU MAY HAVE A DEDUCTIBLE THAT WILL NEED TO BE MET. SO PLEASE MAKE SURE YOU CHECK YOUR BENEFITS FOR A SCREENING COLONOSCOPY AS WELL AS A DIAGNOSTIC COLONOSCOPY.      

## 2021-07-22 NOTE — Progress Notes (Addendum)
Gastroenterology Pre-Procedure Review  Request Date: 07/22/2021 Requesting Physician: Colleen Can, NP @ [facility], no previous TCS, family hx of colon cancer (half brother)           PATIENT REVIEW QUESTIONS: The patient responded to the following health history questions as indicated:    1. Diabetes Melitis: no 2. Joint replacements in the past 12 months: no 3. Major health problems in the past 3 months: no 4. Has an artificial valve or MVP: no 5. Has a defibrillator: no 6. Has been advised in past to take antibiotics in advance of a procedure like teeth cleaning: no 7. Family history of colon cancer: yes, half brother: age 61  8. Alcohol Use: yes, 1 drink on holidays 9. Illicit drug Use: yes, marijuana daily 10. History of sleep apnea: no  11. History of coronary artery or other vascular stents placed within the last 12 months: no 12. History of any prior anesthesia complications: no 13. Body mass index is 37.37 kg/m.    MEDICATIONS & ALLERGIES:    Patient reports the following regarding taking any blood thinners:   Plavix? no Aspirin? no Coumadin? no Brilinta? no Xarelto? no Eliquis? no Pradaxa? no Savaysa? no Effient? no  Patient confirms/reports the following medications:  Current Outpatient Medications  Medication Sig Dispense Refill   amLODipine (NORVASC) 10 MG tablet Take 10 mg by mouth daily.     methylPREDNISolone (MEDROL DOSEPAK) 4 MG TBPK tablet as directed.     metoprolol tartrate (LOPRESSOR) 50 MG tablet Take 50 mg by mouth 2 (two) times daily.     rosuvastatin (CRESTOR) 5 MG tablet daily.     sildenafil (VIAGRA) 50 MG tablet Take 50 mg by mouth as needed for erectile dysfunction.     tamsulosin (FLOMAX) 0.4 MG CAPS capsule daily.     No current facility-administered medications for this visit.    Patient confirms/reports the following allergies:  No Known Allergies  No orders of the defined types were placed in this encounter.   AUTHORIZATION  INFORMATION Primary Insurance: Rudi Heap,  ID #: [id number] Pre-Cert / Berkley Harvey required: No, not required per Gerarda Gunther Pre-Cert / Auth #: Dorene Grebe 07/26/2021  SCHEDULE INFORMATION: Procedure has been scheduled as follows:  Date: 07/30/2021, Time: 12:45 Location: [facility] with Dr. Marletta Lor  This Gastroenterology Pre-Precedure Review Form is being routed to the following provider(s): Ermalinda Memos, PA-C

## 2021-07-26 ENCOUNTER — Other Ambulatory Visit: Payer: Self-pay | Admitting: *Deleted

## 2021-07-30 ENCOUNTER — Encounter (HOSPITAL_COMMUNITY): Payer: Self-pay

## 2021-07-30 ENCOUNTER — Encounter (HOSPITAL_COMMUNITY)
Admission: RE | Disposition: A | Discharge status: Home / Self Care | Payer: Self-pay | Source: Home / Self Care | Attending: Internal Medicine

## 2021-07-30 ENCOUNTER — Other Ambulatory Visit: Payer: Self-pay

## 2021-07-30 ENCOUNTER — Telehealth: Payer: Self-pay | Admitting: Internal Medicine

## 2021-07-30 ENCOUNTER — Ambulatory Visit (HOSPITAL_COMMUNITY): Payer: Medicare (Managed Care) | Admitting: Certified Registered Nurse Anesthetist

## 2021-07-30 ENCOUNTER — Ambulatory Visit (HOSPITAL_COMMUNITY)
Admission: RE | Admit: 2021-07-30 | Discharge: 2021-07-30 | Disposition: A | Discharge status: Home / Self Care | Payer: Medicare (Managed Care) | Attending: Internal Medicine | Admitting: Internal Medicine

## 2021-07-30 DIAGNOSIS — Z79899 Other long term (current) drug therapy: Secondary | ICD-10-CM | POA: Diagnosis not present

## 2021-07-30 DIAGNOSIS — Z1211 Encounter for screening for malignant neoplasm of colon: Secondary | ICD-10-CM | POA: Diagnosis not present

## 2021-07-30 DIAGNOSIS — K648 Other hemorrhoids: Secondary | ICD-10-CM | POA: Diagnosis not present

## 2021-07-30 DIAGNOSIS — F17210 Nicotine dependence, cigarettes, uncomplicated: Secondary | ICD-10-CM | POA: Insufficient documentation

## 2021-07-30 HISTORY — PX: COLONOSCOPY WITH PROPOFOL: SHX5780

## 2021-07-30 SURGERY — COLONOSCOPY WITH PROPOFOL
Anesthesia: General

## 2021-07-30 MED ORDER — PROPOFOL 500 MG/50ML IV EMUL
INTRAVENOUS | Status: DC | PRN
  Administered 2021-07-30: 150 ug/kg/min via INTRAVENOUS

## 2021-07-30 MED ORDER — STERILE WATER FOR IRRIGATION IR SOLN
Status: DC | PRN
  Administered 2021-07-30: 200 mL

## 2021-07-30 MED ORDER — LACTATED RINGERS IV SOLN: INTRAVENOUS | Status: DC

## 2021-07-30 MED ORDER — PROPOFOL 10 MG/ML IV BOLUS
INTRAVENOUS | Status: DC | PRN
  Administered 2021-07-30: 140 mg via INTRAVENOUS

## 2021-07-30 NOTE — Discharge Instructions (Addendum)
  Colonoscopy Discharge Instructions  Read the instructions outlined below and refer to this sheet in the next few weeks. These discharge instructions provide you with general information on caring for yourself after you leave the hospital. Your doctor may also give you specific instructions. While your treatment has been planned according to the most current medical practices available, unavoidable complications occasionally occur.   ACTIVITY You may resume your regular activity, but move at a slower pace for the next 24 hours.  Take frequent rest periods for the next 24 hours.  Walking will help get rid of the air and reduce the bloated feeling in your belly (abdomen).  No driving for 24 hours (because of the medicine (anesthesia) used during the test).   Do not sign any important legal documents or operate any machinery for 24 hours (because of the anesthesia used during the test).  NUTRITION Drink plenty of fluids.  You may resume your normal diet as instructed by your doctor.  Begin with a light meal and progress to your normal diet. Heavy or fried foods are harder to digest and may make you feel sick to your stomach (nauseated).  Avoid alcoholic beverages for 24 hours or as instructed.  MEDICATIONS You may resume your normal medications unless your doctor tells you otherwise.  WHAT YOU CAN EXPECT TODAY Some feelings of bloating in the abdomen.  Passage of more gas than usual.  Spotting of blood in your stool or on the toilet paper.  IF YOU HAD POLYPS REMOVED DURING THE COLONOSCOPY: No aspirin products for 7 days or as instructed.  No alcohol for 7 days or as instructed.  Eat a soft diet for the next 24 hours.  FINDING OUT THE RESULTS OF YOUR TEST Not all test results are available during your visit. If your test results are not back during the visit, make an appointment with your caregiver to find out the results. Do not assume everything is normal if you have not heard from your  caregiver or the medical facility. It is important for you to follow up on all of your test results.  SEEK IMMEDIATE MEDICAL ATTENTION IF: You have more than a spotting of blood in your stool.  Your belly is swollen (abdominal distention).  You are nauseated or vomiting.  You have a temperature over 101.  You have abdominal pain or discomfort that is severe or gets worse throughout the day.   Unfortunately the right side your colon was not cleaned out.  I am unable to rule out polyps due to the prep.  I did not see any obvious large polyps or evidence of colon cancer.  Would recommend we repeat in 3 to 6 months with extended colon prep.  We will call you next week to arrange this.  I hope you have a great rest of your week!  Hennie Duos. Marletta Lor, D.O. Gastroenterology and Hepatology [facility]

## 2021-07-30 NOTE — Op Note (Signed)
[facility] Patient Name: Richard Buck Procedure Date: 07/30/2021 11:17 AM MRN: [id number] Date of Birth: 02/20/1951 Attending MD: Elon Alas. Abbey Chatters DO CSN: [id number] Age: 70 Admit Type: Outpatient Procedure:                Colonoscopy Indications:              Screening for colorectal malignant neoplasm Providers:                Elon Alas. Abbey Chatters, DO, Jessica Boudreaux, Randa Spike, Technician Referring MD:              Medicines:                See the Anesthesia note for documentation of the                            administered medications Complications:            No immediate complications. Estimated Blood Loss:     Estimated blood loss: none. Procedure:                Pre-Anesthesia Assessment:                           - The anesthesia plan was to use monitored                            anesthesia care (MAC).                           After obtaining informed consent, the colonoscope                            was passed under direct vision. Throughout the                            procedure, the patient's blood pressure, pulse, and                            oxygen saturations were monitored continuously. The                            [id number]-HQ190L ([id number]) scope was introduced through                            the anus and advanced to the the cecum, identified                            by appendiceal orifice and ileocecal valve. The                            colonoscopy was performed without difficulty. The                            patient tolerated the procedure well. The quality  of the bowel preparation was evaluated using the                            BBPS [facility] Bowel Preparation Scale) with scores                            of: Right Colon = 1 (portion of mucosa seen, but                            other areas not well seen due to staining, residual                            stool and/or opaque  liquid), Transverse Colon = 2                            (minor amount of residual staining, small fragments                            of stool and/or opaque liquid, but mucosa seen                            well) and Left Colon = 2 (minor amount of residual                            staining, small fragments of stool and/or opaque                            liquid, but mucosa seen well). The total BBPS score                            equals 5. The quality of the bowel preparation was                            inadequate. Scope In: 12:09:30 PM Scope Out: 12:18:40 PM Scope Withdrawal Time: 0 hours 4 minutes 34 seconds  Total Procedure Duration: 0 hours 9 minutes 10 seconds  Findings:      The perianal and digital rectal examinations were normal.      Non-bleeding internal hemorrhoids were found during endoscopy.      Copious quantities of stool was found in the transverse colon, in the       ascending colon and in the cecum, precluding visualization. Lavage of       the area was performed using a large amount of sterile water, resulting       in incomplete clearance with continued poor visualization. Impression:               - Preparation of the colon was inadequate.                           - Non-bleeding internal hemorrhoids.                           - Stool in the transverse colon, in the ascending  colon and in the cecum.                           - No specimens collected. Moderate Sedation:      Per Anesthesia Care Recommendation:           - Patient has a contact number available for                            emergencies. The signs and symptoms of potential                            delayed complications were discussed with the                            patient. Return to normal activities tomorrow.                            Written discharge instructions were provided to the                            patient.                           -  Resume previous diet.                           - Continue present medications.                           - Repeat colonoscopy in 3-6 months because the                            bowel preparation was poor. Will need extended prep.                           - Return to GI clinic PRN. Procedure Code(s):        --- Professional ---                           G6269, Colorectal cancer screening; colonoscopy on                            individual not meeting criteria for high risk Diagnosis Code(s):        --- Professional ---                           Z12.11, Encounter for screening for malignant                            neoplasm of colon                           K64.8, Other hemorrhoids CPT copyright 2019 American Medical Association. All rights reserved. The codes documented in this report are preliminary and upon coder review may  be revised to meet current compliance requirements. Juanda Crumble  Arlee Muslim, DO Elon Alas. Abbey Chatters, DO 07/30/2021 12:22:08 PM This report has been signed electronically. Number of Addenda: 0

## 2021-07-30 NOTE — Transfer of Care (Signed)
Immediate Anesthesia Transfer of Care Note  Patient: Richard Buck.  Procedure(s) Performed: COLONOSCOPY WITH PROPOFOL  Patient Location: Endoscopy Unit  Anesthesia Type:General  Level of Consciousness: awake  Airway & Oxygen Therapy: Patient Spontanous Breathing  Post-op Assessment: Report given to RN and Post -op Vital signs reviewed and stable  Post vital signs: Reviewed and stable  Last Vitals:  Vitals Value Taken Time  BP    Temp    Pulse 71   Resp 20   SpO2 92%     Last Pain:  Vitals:   07/30/21 1206  TempSrc:   PainSc: 0-No pain      Patients Stated Pain Goal: 6 (07/30/21 1117)  Complications: No notable events documented.

## 2021-07-30 NOTE — Anesthesia Preprocedure Evaluation (Signed)
Anesthesia Evaluation  Patient identified by MRN, date of birth, ID band Patient awake    Reviewed: Allergy & Precautions, H&P , NPO status , Patient's Chart, lab work & pertinent test results, reviewed documented beta blocker date and time   Airway Mallampati: II  TM Distance: >3 FB Neck ROM: full    Dental no notable dental hx.    Pulmonary neg pulmonary ROS, Current Smoker,    Pulmonary exam normal breath sounds clear to auscultation       Cardiovascular Exercise Tolerance: Good hypertension, negative cardio ROS   Rhythm:regular Rate:Normal     Neuro/Psych negative neurological ROS  negative psych ROS   GI/Hepatic negative GI ROS, Neg liver ROS,   Endo/Other  negative endocrine ROS  Renal/GU negative Renal ROS  negative genitourinary   Musculoskeletal   Abdominal   Peds  Hematology negative hematology ROS (+)   Anesthesia Other Findings   Reproductive/Obstetrics negative OB ROS                             Anesthesia Physical Anesthesia Plan  ASA: 2  Anesthesia Plan: General   Post-op Pain Management:    Induction:   PONV Risk Score and Plan: Propofol infusion  Airway Management Planned:   Additional Equipment:   Intra-op Plan:   Post-operative Plan:   Informed Consent: I have reviewed the patients History and Physical, chart, labs and discussed the procedure including the risks, benefits and alternatives for the proposed anesthesia with the patient or authorized representative who has indicated his/her understanding and acceptance.     Dental Advisory Given  Plan Discussed with: CRNA  Anesthesia Plan Comments:         Anesthesia Quick Evaluation  

## 2021-07-30 NOTE — H&P (Signed)
Primary Care Physician:  Jani Gravel, MD Primary Gastroenterologist:  Dr. Abbey Chatters  Pre-Procedure History & Physical: HPI:  Richard Buck. is a 70 y.o. male is here for a colonoscopy for colon cancer screening purposes.  Patient denies any family history of colorectal cancer.  No melena or hematochezia.  No abdominal pain or unintentional weight loss.  No change in bowel habits.  Overall feels well from a GI standpoint.  Past Medical History:  Diagnosis Date   Hypertension     History reviewed. No pertinent surgical history.  Prior to Admission medications   Medication Sig Start Date End Date Taking? Authorizing Provider  amLODipine (NORVASC) 10 MG tablet Take 10 mg by mouth daily.   Yes [provider]  Colloidal Oatmeal (GOLD BOND ECZEMA RELIEF) 2 % CREA Apply 1 application topically daily as needed (eczema).   Yes [provider]  metoprolol tartrate (LOPRESSOR) 50 MG tablet Take 50 mg by mouth 2 (two) times daily.   Yes [provider]  rosuvastatin (CRESTOR) 5 MG tablet Take 5 mg by mouth daily. 04/09/21  Yes [provider]  sildenafil (VIAGRA) 50 MG tablet Take 50 mg by mouth as needed for erectile dysfunction.   Yes [provider]  tamsulosin (FLOMAX) 0.4 MG CAPS capsule Take 0.4 mg by mouth daily. 04/10/21  Yes [provider]  SUPREP BOWEL PREP KIT 17.5-3.13-1.6 GM/177ML SOLN Take 354 mLs by mouth as directed. 07/22/21   [provider]    Allergies as of 07/23/2021   (No Known Allergies)    History reviewed. No pertinent family history.  Social History   Socioeconomic History   Marital status: Single    Spouse name: Not on file   Number of children: Not on file   Years of education: Not on file   Highest education level: Not on file  Occupational History   Not on file  Tobacco Use   Smoking status: Every Day    Packs/day: 0.10    Types: Cigarettes   Smokeless tobacco: Never   Tobacco comments:    2  cigarettes a day  Substance and Sexual Activity   Alcohol use: Not Currently   Drug use: Not Currently    Types: Marijuana   Sexual activity: Not on file  Other Topics Concern   Not on file  Social History Narrative   Not on file   Social Determinants of Health   Financial Resource Strain: Not on file  Food Insecurity: Not on file  Transportation Needs: Not on file  Physical Activity: Not on file  Stress: Not on file  Social Connections: Not on file  Intimate Partner Violence: Not on file    Review of Systems: See HPI, otherwise negative ROS  Physical Exam: Vital signs in last 24 hours: Temp:  [98 F (36.7 C)] 98 F (36.7 C) (09/23 1117) Pulse Rate:  [73] 73 (09/23 1117) Resp:  [19] 19 (09/23 1117) BP: (162)/(95) 162/95 (09/23 1119) SpO2:  [99 %] 99 % (09/23 1117) Weight:  [139.3 kg] 139.3 kg (09/23 1117)   General:   Alert,  Well-developed, well-nourished, pleasant and cooperative in NAD Head:  Normocephalic and atraumatic. Eyes:  Sclera clear, no icterus.   Conjunctiva pink. Ears:  Normal auditory acuity. Nose:  No deformity, discharge,  or lesions. Mouth:  No deformity or lesions, dentition normal. Neck:  Supple; no masses or thyromegaly. Lungs:  Clear throughout to auscultation.   No wheezes, crackles, or rhonchi. No acute distress. Heart:  Regular rate and rhythm; no murmurs, clicks, rubs,  or gallops. Abdomen:  Soft, nontender and nondistended. No masses, hepatosplenomegaly or hernias noted. Normal bowel sounds, without guarding, and without rebound.   Msk:  Symmetrical without gross deformities. Normal posture. Extremities:  Without clubbing or edema. Neurologic:  Alert and  oriented x4;  grossly normal neurologically. Skin:  Intact without significant lesions or rashes. Cervical Nodes:  No significant cervical adenopathy. Psych:  Alert and cooperative. Normal mood and affect.  Impression/Plan: Richard Buck. is here for a colonoscopy to be performed  for colon cancer screening purposes.  The risks of the procedure including infection, bleed, or perforation as well as benefits, limitations, alternatives and imponderables have been reviewed with the patient. Questions have been answered. All parties agreeable.

## 2021-07-30 NOTE — Telephone Encounter (Signed)
Patient presented for screening colonoscopy today.  Unfortunately the right side of his colon was not cleaned out.  Can we reschedule him in 3 to 6 months with extended colon prep.  ASA 2.  Thank you

## 2021-07-30 NOTE — Anesthesia Postprocedure Evaluation (Signed)
Anesthesia Post Note  Patient: Richard Buck.  Procedure(s) Performed: COLONOSCOPY WITH PROPOFOL  Patient location during evaluation: Phase II Anesthesia Type: General Level of consciousness: awake Pain management: pain level controlled Vital Signs Assessment: post-procedure vital signs reviewed and stable Respiratory status: spontaneous breathing and respiratory function stable Cardiovascular status: blood pressure returned to baseline and stable Postop Assessment: no headache and no apparent nausea or vomiting Anesthetic complications: no Comments: Late entry   No notable events documented.   Last Vitals:  Vitals:   07/30/21 1119 07/30/21 1223  BP: (!) 162/95 111/62  Pulse:    Resp:  14  Temp:  36.8 C  SpO2:  93%    Last Pain:  Vitals:   07/30/21 1223  TempSrc: Axillary  PainSc: 0-No pain                 Windell Norfolk

## 2021-08-02 NOTE — Telephone Encounter (Signed)
Please NIC for TCS in 3-6 months per Dr. Marletta Lor.

## 2021-08-05 ENCOUNTER — Encounter (HOSPITAL_COMMUNITY): Payer: Self-pay | Admitting: Internal Medicine

## 2021-09-08 ENCOUNTER — Ambulatory Visit: Payer: Medicare (Managed Care) | Admitting: Internal Medicine

## 2021-11-02 ENCOUNTER — Encounter: Payer: Self-pay | Admitting: *Deleted

## 2021-12-08 ENCOUNTER — Emergency Department (HOSPITAL_COMMUNITY)
Admission: EM | Admit: 2021-12-08 | Discharge: 2021-12-08 | Disposition: A | Discharge status: Home / Self Care | Payer: Medicare (Managed Care) | Attending: Emergency Medicine | Admitting: Emergency Medicine

## 2021-12-08 ENCOUNTER — Emergency Department (HOSPITAL_COMMUNITY): Payer: Medicare (Managed Care)

## 2021-12-08 ENCOUNTER — Other Ambulatory Visit: Payer: Self-pay

## 2021-12-08 ENCOUNTER — Encounter (HOSPITAL_COMMUNITY): Payer: Self-pay | Admitting: Emergency Medicine

## 2021-12-08 DIAGNOSIS — W208XXA Other cause of strike by thrown, projected or falling object, initial encounter: Secondary | ICD-10-CM | POA: Diagnosis not present

## 2021-12-08 DIAGNOSIS — S6992XA Unspecified injury of left wrist, hand and finger(s), initial encounter: Secondary | ICD-10-CM | POA: Diagnosis present

## 2021-12-08 DIAGNOSIS — S63502A Unspecified sprain of left wrist, initial encounter: Secondary | ICD-10-CM | POA: Insufficient documentation

## 2021-12-08 DIAGNOSIS — W19XXXA Unspecified fall, initial encounter: Secondary | ICD-10-CM

## 2021-12-08 MED ORDER — OXYCODONE-ACETAMINOPHEN 5-325 MG PO TABS
1.0000 | ORAL_TABLET | ORAL | Status: DC | PRN
  Administered 2021-12-08: 1 via ORAL
  Filled 2021-12-08: qty 1

## 2021-12-08 NOTE — ED Provider Notes (Signed)
[facility] EMERGENCY DEPARTMENT Provider Note   CSN: [id number] Arrival date & time: 12/08/21  1741     History  Chief Complaint  Patient presents with   Wrist Injury    Richard Buck. is a 71 y.o. male who presents to the ED today with complaint of sudden onset, constant, sharp, left wrist pain s/p FOOSH injury that occurred earlier today.  York Spaniel he was changing a tire on his car when he tripped over the curb and landed backwards on his outstretched left hand.  He reports pain and swelling since that time.  He reports pain is worse with flexion of the wrist.  He denies any head injury or loss of consciousness.  He has no other complaints.   The history is provided by the patient and medical records.      Home Medications Prior to Admission medications   Medication Sig Start Date End Date Taking? Authorizing Provider  amLODipine (NORVASC) 10 MG tablet Take 10 mg by mouth daily.    [provider]  Colloidal Oatmeal (GOLD BOND ECZEMA RELIEF) 2 % CREA Apply 1 application topically daily as needed (eczema).    [provider]  metoprolol tartrate (LOPRESSOR) 50 MG tablet Take 50 mg by mouth 2 (two) times daily.    [provider]  rosuvastatin (CRESTOR) 5 MG tablet Take 5 mg by mouth daily. 04/09/21   [provider]  sildenafil (VIAGRA) 50 MG tablet Take 50 mg by mouth as needed for erectile dysfunction.    [provider]  tamsulosin (FLOMAX) 0.4 MG CAPS capsule Take 0.4 mg by mouth daily. 04/10/21   [provider]      Allergies    Patient has no known allergies.    Review of Systems   Review of Systems  Constitutional:  Negative for chills and fever.  Musculoskeletal:  Positive for arthralgias and joint swelling.  Skin:  Negative for wound.  Neurological:  Negative for syncope and headaches.  All other systems reviewed and are negative.  Physical Exam Updated Vital Signs BP (!) 152/95 (BP Location:  Right Arm)    Pulse 62    Temp 98.3 F (36.8 C) (Oral)    Resp 18    SpO2 96%  Physical Exam Vitals and nursing note reviewed.  Constitutional:      Appearance: He is not ill-appearing.  HENT:     Head: Normocephalic and atraumatic.  Eyes:     Conjunctiva/sclera: Conjunctivae normal.  Cardiovascular:     Rate and Rhythm: Normal rate and regular rhythm.  Pulmonary:     Effort: Pulmonary effort is normal.     Breath sounds: Normal breath sounds.  Musculoskeletal:     Comments: Moderate swelling noted to L wrist with TTP along the dorsal aspect of the distal radius. ROM limited s/2 pain however pt able to flex and extend wrist. 2+ radial pulse. Sensation intact throughout. Cap refill <  2 seconds to all digits. No TTP to L elbow and ROM intact to elbow joint.   Skin:    General: Skin is warm and dry.     Coloration: Skin is not jaundiced.  Neurological:     Mental Status: He is alert.    ED Results / Procedures / Treatments   Labs (all labs ordered are listed, but only abnormal results are displayed) Labs Reviewed - No data to display  EKG None  Radiology DG Wrist Complete Left  Result Date: 12/08/2021 CLINICAL DATA:  Fall on outstretched hand today with wrist pain, initial encounter EXAM: LEFT WRIST - COMPLETE 3+ VIEW COMPARISON:  None. FINDINGS: There is no evidence of fracture or dislocation. There is no evidence of arthropathy or other focal bone abnormality. Soft tissues are unremarkable. IMPRESSION: No acute abnormality noted. Electronically Signed   By: Alcide Clever M.D.   On: 12/08/2021 19:16    Procedures Procedures    Medications Ordered in ED Medications  oxyCODONE-acetaminophen (PERCOCET/ROXICET) 5-325 MG per tablet 1 tablet (1 tablet Oral Given 12/08/21 1851)    ED Course/ Medical Decision Making/ A&P                           Medical Decision Making 71 year old male who presents to the ED today with complaint of left wrist pain status post fall on  outstretched wrist that occurred earlier today.  On arrival to the ED vitals are stable.  Patient was medically screened and work-up started including x-ray did not show any acute bony abnormalities.  On my exam he has swelling and tenderness palpation along the distal radius.  His range of motion is limited however able to flex and extend.  He has more pain with flexion of the wrist.  He is neurovascularly intact throughout.  Suspect sprain at this time.  Will provide Ace wrap.  Patient instructed on RICE therapy and ibuprofen/Tylenol as needed for pain.  He is provided Ortho follow-up as needed.  He is in agreement with plan and stable for discharge.   Problems Addressed: Fall, initial encounter: acute illness or injury Sprain of left wrist, initial encounter: acute illness or injury  Amount and/or Complexity of Data Reviewed Radiology: ordered. Decision-making details documented in ED Course.  Risk Prescription drug management.           Final Clinical Impression(s) / ED Diagnoses Final diagnoses:  Fall, initial encounter  Sprain of left wrist, initial encounter    Rx / DC Orders ED Discharge Orders     None        Discharge Instructions      Your xray did not show any signs of fractures or dislocation at this time. Your pain is likely related to a sprain.   Please rest, ice, and elevate your wrist to help with pain/inflammation. You can take Ibuprofen and Tylenol as needed for pain.   Follow up with Orthopedist Dr. Odis Hollingshead as needed for continued pain  Return to the ED for any new/worsening symptoms        Tanda Rockers, Cordelia Poche 12/08/21 2143    Benjiman Core, MD 12/09/21 1426

## 2021-12-08 NOTE — Discharge Instructions (Signed)
Your xray did not show any signs of fractures or dislocation at this time. Your pain is likely related to a sprain.   Please rest, ice, and elevate your wrist to help with pain/inflammation. You can take Ibuprofen and Tylenol as needed for pain.   Follow up with Orthopedist Dr. Odis Hollingshead as needed for continued pain  Return to the ED for any new/worsening symptoms

## 2021-12-08 NOTE — ED Provider Triage Note (Signed)
Emergency Medicine Provider Triage Evaluation Note  Richard Faulk. , a 71 y.o. male  was evaluated in triage.  Pt complains of cute onset of left wrist pain.  Patient was putting air in his tires and tripped on a curb.  He fell on an outstretched left hand.  Complains of left wrist pain.  Pain radiates into the forearm with movement.  Denies head or neck injury..  Review of Systems  Positive: Wrist pain Negative: Headache  Physical Exam  BP (!) 152/95 (BP Location: Right Arm)    Pulse 62    Temp 98.3 F (36.8 C) (Oral)    Resp 18    SpO2 96%  Gen:   Awake, no distress   Resp:  Normal effort  MSK:   Moves extremities without difficulty  Other:  Decreased range of motion left wrist due to pain, very tender over the distal radius without deformity  Medical Decision Making  Medically screening exam initiated at 6:40 PM.  Appropriate orders placed.  Sherrie Sport. was informed that the remainder of the evaluation will be completed by another provider, this initial triage assessment does not replace that evaluation, and the importance of remaining in the ED until their evaluation is complete.     Renne Crigler, PA-C 12/08/21 2956

## 2021-12-08 NOTE — ED Triage Notes (Signed)
Patient coming from home, complaint of a wrist injury after falling backward this afternoon. Denies hitting head. Only endorses left wrist pain.

## 2022-08-28 ENCOUNTER — Other Ambulatory Visit: Payer: Self-pay

## 2022-08-28 ENCOUNTER — Encounter (HOSPITAL_COMMUNITY): Payer: Self-pay | Admitting: Emergency Medicine

## 2022-08-28 ENCOUNTER — Emergency Department (HOSPITAL_COMMUNITY): Payer: Medicare (Managed Care)

## 2022-08-28 ENCOUNTER — Emergency Department (HOSPITAL_COMMUNITY)
Admission: EM | Admit: 2022-08-28 | Discharge: 2022-08-28 | Disposition: A | Discharge status: Home / Self Care | Payer: Medicare (Managed Care) | Attending: Emergency Medicine | Admitting: Emergency Medicine

## 2022-08-28 DIAGNOSIS — M10031 Idiopathic gout, right wrist: Secondary | ICD-10-CM | POA: Insufficient documentation

## 2022-08-28 DIAGNOSIS — Z7952 Long term (current) use of systemic steroids: Secondary | ICD-10-CM | POA: Insufficient documentation

## 2022-08-28 DIAGNOSIS — Z79899 Other long term (current) drug therapy: Secondary | ICD-10-CM | POA: Insufficient documentation

## 2022-08-28 DIAGNOSIS — M25531 Pain in right wrist: Secondary | ICD-10-CM | POA: Diagnosis present

## 2022-08-28 MED ORDER — OXYCODONE-ACETAMINOPHEN 5-325 MG PO TABS: 1.0000 | ORAL_TABLET | Freq: Four times a day (QID) | ORAL | 0 refills | Status: AC | PRN

## 2022-08-28 MED ORDER — HYDROCODONE-ACETAMINOPHEN 5-325 MG PO TABS
1.0000 | ORAL_TABLET | Freq: Once | ORAL | Status: AC
  Administered 2022-08-28: 1 via ORAL
  Filled 2022-08-28: qty 1

## 2022-08-28 MED ORDER — PREDNISONE 20 MG PO TABS: ORAL_TABLET | ORAL | 0 refills | Status: DC

## 2022-08-28 NOTE — Discharge Instructions (Signed)
Use Tylenol every 4 hours and ice packs as needed for pain. For severe pain take oxycodon however realize they have the potential for addiction and it can make you sleepy and has tylenol in it.  No operating machinery while taking. Take steroids as directed. Follow-up with your doctor if no improvement in approximately 3 days or if you develop fevers or other concerns.

## 2022-08-28 NOTE — ED Provider Triage Note (Signed)
Emergency Medicine Provider Triage Evaluation Note  Jacquel Mccamish. , a 71 y.o. male  was evaluated in triage.  Pt complains of right wrist pain.  Patient states that right wrist pain began approximately 2 days ago.  He noted eating ribeye steak the day before.  Has a history of gout and states this feels similar.  Denies any known trauma or mechanism of injury.  States the pain is got worse since onset.  Denies fever, chills, night sweats, history of IV drug use..  Review of Systems  Positive: See above Negative:   Physical Exam  BP (!) 149/93 (BP Location: Left Arm)   Pulse 72   Temp 98.4 F (36.9 C) (Oral)   Resp 16   SpO2 96%  Gen:   Awake, no distress   Resp:  Normal effort  MSK:   Moves extremities without difficulty  Other:  Distal radius with swelling and slight erythema.  Area is warm to the touch.  Pain is elicited with movement of right wrist.  Patient complaining no sensory deficits distally.  Radial pulses full and intact bilaterally.  Medical Decision Making  Medically screening exam initiated at 12:40 PM.  Appropriate orders placed.  Elvina Sidle. was informed that the remainder of the evaluation will be completed by another provider, this initial triage assessment does not replace that evaluation, and the importance of remaining in the ED until their evaluation is complete.     Wilnette Kales, [place] 08/28/22 1241

## 2022-08-28 NOTE — ED Triage Notes (Signed)
Pt here from home with c/o right arm pain , arm is swollen and warm to touch , pt has hx of gout

## 2022-08-28 NOTE — ED Provider Notes (Signed)
[facility] EMERGENCY DEPARTMENT Provider Note   CSN: [id number] Arrival date & time: 08/28/22  1206     History  Chief Complaint  Patient presents with   Arm Pain    Richard Buck. is a 71 y.o. male.  Patient presents with right wrist pain for just over 2 days.  History of gout and similar to this.  No fevers or chills or injuries.  No night sweats or history of IV drug use.  Pain with movement.  Pain improved since receiving pain meds in the ER.  No history of diabetes.       Home Medications Prior to Admission medications   Medication Sig Start Date End Date Taking? Authorizing Provider  oxyCODONE-acetaminophen (PERCOCET/ROXICET) 5-325 MG tablet Take 1-2 tablets by mouth every 6 (six) hours as needed for severe pain. 08/28/22  Yes Elnora Morrison, MD  predniSONE (DELTASONE) 20 MG tablet Take 3 tabs a day for 5 days 08/28/22  Yes Elnora Morrison, MD  amLODipine (NORVASC) 10 MG tablet Take 10 mg by mouth daily.    [provider]  Colloidal Oatmeal (GOLD BOND ECZEMA RELIEF) 2 % CREA Apply 1 application topically daily as needed (eczema).    [provider]  metoprolol tartrate (LOPRESSOR) 50 MG tablet Take 50 mg by mouth 2 (two) times daily.    [provider]  rosuvastatin (CRESTOR) 5 MG tablet Take 5 mg by mouth daily. 04/09/21   [provider]  sildenafil (VIAGRA) 50 MG tablet Take 50 mg by mouth as needed for erectile dysfunction.    [provider]  tamsulosin (FLOMAX) 0.4 MG CAPS capsule Take 0.4 mg by mouth daily. 04/10/21   [provider]      Allergies    Patient has no known allergies.    Review of Systems   Review of Systems  Constitutional:  Negative for chills and fever.  HENT:  Negative for congestion.   Eyes:  Negative for visual disturbance.  Respiratory:  Negative for shortness of breath.   Cardiovascular:  Negative for chest pain.  Gastrointestinal:  Negative for abdominal pain and  vomiting.  Genitourinary:  Negative for dysuria and flank pain.  Musculoskeletal:  Positive for joint swelling. Negative for back pain, neck pain and neck stiffness.  Skin:  Negative for rash.  Neurological:  Negative for light-headedness and headaches.    Physical Exam Updated Vital Signs BP (!) 149/93 (BP Location: Left Arm)   Pulse 72   Temp 98.4 F (36.9 C) (Oral)   Resp 16   Ht 6\' 4"  (1.93 m)   Wt (!) 139.3 kg   SpO2 96%   BMI 37.37 kg/m  Physical Exam Vitals and nursing note reviewed.  Constitutional:      General: He is not in acute distress.    Appearance: He is well-developed.  HENT:     Head: Normocephalic and atraumatic.     Mouth/Throat:     Mouth: Mucous membranes are moist.  Eyes:     General:        Right eye: No discharge.        Left eye: No discharge.     Conjunctiva/sclera: Conjunctivae normal.  Neck:     Trachea: No tracheal deviation.  Cardiovascular:     Rate and Rhythm: Normal rate and regular rhythm.     Heart sounds: No murmur heard. Pulmonary:     Effort: Pulmonary effort is normal.     Breath sounds: Normal breath  sounds.  Abdominal:     General: There is no distension.     Palpations: Abdomen is soft.     Tenderness: There is no abdominal tenderness. There is no guarding.  Musculoskeletal:        General: Swelling and tenderness present.     Cervical back: Normal range of motion and neck supple. No rigidity.     Comments: Patient has mild to moderate swelling and tenderness right wrist worse with extension.  No external rash erythema or induration.  No significant warmth.  Skin:    General: Skin is warm.     Capillary Refill: Capillary refill takes less than 2 seconds.     Findings: No rash.  Neurological:     General: No focal deficit present.     Mental Status: He is alert.  Psychiatric:        Mood and Affect: Mood normal.     ED Results / Procedures / Treatments   Labs (all labs ordered are listed, but only abnormal  results are displayed) Labs Reviewed - No data to display  EKG None  Radiology DG Wrist Complete Right  Result Date: 08/28/2022 CLINICAL DATA:  Pain, possible gout EXAM: RIGHT WRIST - COMPLETE 3+ VIEW COMPARISON:  07/08/2018 FINDINGS: There is no evidence of fracture or dislocation. There is no evidence of arthropathy, erosion, or other focal bone abnormality. Soft tissues are unremarkable. IMPRESSION: Negative. Electronically Signed   By: Corlis Leak M.D.   On: 08/28/2022 13:14    Procedures Procedures    Medications Ordered in ED Medications  HYDROcodone-acetaminophen (NORCO/VICODIN) 5-325 MG per tablet 1 tablet (1 tablet Oral Given 08/28/22 1245)    ED Course/ Medical Decision Making/ A&P                           Medical Decision Making Risk Prescription drug management.   Patient presents with isolated right wrist pain similar to previous gout.  No fevers, no external signs of infection such as cellulitis or abscess.  Very low suspicion for septic joint given history of similar, no fever and well-appearing otherwise.  Discussed importance of recheck if no improvement in 2 to 3 days.  Prednisone and pain medication sent to his pharmacy.  Patient comfortable this plan.        Final Clinical Impression(s) / ED Diagnoses Final diagnoses:  Acute idiopathic gout of right wrist    Rx / DC Orders ED Discharge Orders          Ordered    oxyCODONE-acetaminophen (PERCOCET/ROXICET) 5-325 MG tablet  Every 6 hours PRN        08/28/22 1654    predniSONE (DELTASONE) 20 MG tablet        08/28/22 1654              Blane Ohara, MD 08/28/22 1656

## 2022-08-31 ENCOUNTER — Ambulatory Visit: Payer: Medicare (Managed Care) | Admitting: Sports Medicine

## 2022-09-07 ENCOUNTER — Ambulatory Visit (INDEPENDENT_AMBULATORY_CARE_PROVIDER_SITE_OTHER): Payer: Medicare (Managed Care) | Admitting: Sports Medicine

## 2022-09-07 VITALS — BP 148/84 | Ht 76.0 in | Wt 300.0 lb

## 2022-09-07 DIAGNOSIS — M10031 Idiopathic gout, right wrist: Secondary | ICD-10-CM

## 2022-09-07 MED ORDER — ALLOPURINOL 100 MG PO TABS: 100.0000 mg | ORAL_TABLET | Freq: Every day | ORAL | 2 refills | Status: AC

## 2022-09-07 NOTE — Progress Notes (Signed)
  Richard Buck. - 71 y.o. male MRN [id number]  Date of birth: [birth year]    CHIEF COMPLAINT:   R wrist pain    SUBJECTIVE:   HPI:  Pleasant 71 year old male past male history of gout comes to clinic to be evaluated for right wrist pain.  He was seen in the ER on 10/22 for the same complaint.  He had right wrist swelling and tenderness.  This was consistent with symptoms from his prior gout flares.  He has had gout flares in his toes, knees, and wrists.  He had x-rays in the ER there were negative for fracture.  He was diagnosed with a recurrent gout flare.  He was started on prednisone for 5 days which he just recently completed.  He was here for follow-up care.  Today he reports wrist feeling much better.  It is no longer as tender or swollen.  He does state that these gout flares now becoming more frequent.  He gets about 4/year now.  He would like to discuss next steps.  ROS:     See HPI  PERTINENT  PMH / PSH FH / / SH:  Past Medical, Surgical, Social, and Family History Reviewed & Updated in the EMR.  Pertinent findings include:  Gout  OBJECTIVE: BP (!) 148/84   Ht 6\' 4"  (1.93 m)   Wt 300 lb (136.1 kg)   BMI 36.52 kg/m   Physical Exam:  Vital signs are reviewed.  GEN: Alert and oriented, NAD Pulm: Breathing unlabored PSY: normal mood, congruent affect  MSK: Right wrist -mildly swollen.  No obvious deformity, ecchymosis, or erythema.  Mildly warm to the touch.  He has full range of motion in the wrist in all directions.  5/5 strength throughout.  Median, radial, ulnar nerve function intact.  5/5 grip strength.  ULTRASOUND: Limited ultrasound of right wrist  Small joint effusion.  No evidence of crystals.  No double contour sign.  There is hyperechoic loose body that is likely cartilaginous as it is not seen on prior x-ray.   ASSESSMENT & PLAN:  1.  Right wrist pain secondary to gout  -Patient's history and exam is consistent with a recurrent gout flare.  Since he has  increasing frequency of gout flares, risk/benefits were discussed and he would like to start a preventative medication for this.  I will start him on allopurinol 100 mg daily.  I advised that he follow-up with his primary physician to titrate this dose and monitor uric acid levels.  He can follow-up here as needed.  All questions answered and he agrees to plan.  Dortha Kern, MD PGY-4, Sports Medicine Fellow [place]  Addendum:  Patient seen in the office by fellow.  His history, exam, plan of care were precepted with me.  Karlton Lemon MD Kirt Boys

## 2022-09-07 NOTE — Patient Instructions (Signed)
You had a gout flare in your wrist Since this keeps happening more often, we will prescribe Allopurinol, a medicine to help prevent gout flares. Take Allopurinol 1x per day See your primary doctor to discuss long term dosing of this and to check uric acid blood levels  It was nice meeting you today!

## 2022-10-12 ENCOUNTER — Ambulatory Visit (INDEPENDENT_AMBULATORY_CARE_PROVIDER_SITE_OTHER): Payer: Medicare (Managed Care) | Admitting: Family Medicine

## 2022-10-12 ENCOUNTER — Ambulatory Visit: Payer: Self-pay

## 2022-10-12 VITALS — BP 133/78 | Ht 76.0 in | Wt 300.0 lb

## 2022-10-12 DIAGNOSIS — M10072 Idiopathic gout, left ankle and foot: Secondary | ICD-10-CM

## 2022-10-12 DIAGNOSIS — M79672 Pain in left foot: Secondary | ICD-10-CM

## 2022-10-12 MED ORDER — PREDNISONE 10 MG PO TABS: ORAL_TABLET | ORAL | 0 refills | Status: DC

## 2022-10-12 NOTE — Patient Instructions (Addendum)
Take prednisone as directed x 6 days. We will check your uric acid level (can be done today or at next visit) - we need to titrate that allopurinol medicine so you don't get flares this frequently. Icing 15 minutes at a time as needed. Follow up with me in 1-2 weeks for reevaluation. We may need to start another medicine called colchicine to help prevent this also but wouldn't do this right now.

## 2022-10-13 ENCOUNTER — Encounter: Payer: Self-pay | Admitting: Family Medicine

## 2022-10-13 NOTE — Progress Notes (Signed)
PCP: Pearson Grippe, MD  Subjective:   HPI: Patient is a 71 y.o. male here for left foot pain/swelling.  Patient was seen over a month ago for wrist pain/swelling. Was diagnosed with gout, did very well with prednisone dose pack and was started on allopurinol. He states since Sunday he's started to have increased pain and swelling in left great toe then across his foot. Was very difficult for great toe to touch anything at first. Has been taking percocet for pain.  Past Medical History:  Diagnosis Date   Hypertension     Current Outpatient Medications on File Prior to Visit  Medication Sig Dispense Refill   allopurinol (ZYLOPRIM) 100 MG tablet Take 1 tablet (100 mg total) by mouth daily. 60 tablet 2   amLODipine (NORVASC) 10 MG tablet Take 10 mg by mouth daily.     Colloidal Oatmeal (GOLD BOND ECZEMA RELIEF) 2 % CREA Apply 1 application topically daily as needed (eczema).     metoprolol tartrate (LOPRESSOR) 50 MG tablet Take 50 mg by mouth 2 (two) times daily.     oxyCODONE-acetaminophen (PERCOCET/ROXICET) 5-325 MG tablet Take 1-2 tablets by mouth every 6 (six) hours as needed for severe pain. 10 tablet 0   rosuvastatin (CRESTOR) 5 MG tablet Take 5 mg by mouth daily.     sildenafil (VIAGRA) 50 MG tablet Take 50 mg by mouth as needed for erectile dysfunction.     tamsulosin (FLOMAX) 0.4 MG CAPS capsule Take 0.4 mg by mouth daily.     No current facility-administered medications on file prior to visit.    Past Surgical History:  Procedure Laterality Date   COLONOSCOPY WITH PROPOFOL N/A 07/30/2021   Procedure: COLONOSCOPY WITH PROPOFOL;  Surgeon: Lanelle Bal, DO;  Location: [facility];  Service: Endoscopy;  Laterality: N/A;  12:45 / ASA II    No Known Allergies  BP 133/78 (BP Location: Right Arm)   Ht 6\' 4"  (1.93 m)   Wt 300 lb (136.1 kg)   BMI 36.52 kg/m       No data to display              No data to display              Objective:  Physical  Exam:  Gen: NAD, comfortable in exam room  Left foot/ankle: Mild swelling dorsal forefoot with warmth.  No bruising, other deformity. Very limited movement of 1st MTP with exquisite pain.   TTP 1st MTP with less at 2nd MTP. NV intact distally.  Limited MSK u/s left foot:  Large effusion 1st MTP with double contour sign and some crystal formation seen.  Smaller effusion of 2nd MTP.  Metatarsals appear normal.   Assessment & Plan:  1. Left 1st MTP acute gout flare - prednisone dose pack.  Plan to check uric acid level so we can titrate the allopurinol.  Icing regularly.  F/u in 1-2 weeks.  Consider adding colchicine to help for prevention while titrating allopuinol.

## 2022-10-21 LAB — BASIC METABOLIC PANEL
BUN/Creatinine Ratio: 10 (ref 10–24)
BUN: 14 mg/dL (ref 8–27)
CO2: 23 mmol/L (ref 20–29)
Calcium: 9 mg/dL (ref 8.6–10.2)
Chloride: 105 mmol/L (ref 96–106)
Creatinine, Ser: 1.35 mg/dL — ABNORMAL HIGH (ref 0.76–1.27)
Glucose: 164 mg/dL — ABNORMAL HIGH (ref 70–99)
Potassium: 3.6 mmol/L (ref 3.5–5.2)
Sodium: 145 mmol/L — ABNORMAL HIGH (ref 134–144)
eGFR: 57 mL/min/{1.73_m2} — ABNORMAL LOW (ref >59)

## 2022-10-21 LAB — URIC ACID: Uric Acid: 6 mg/dL (ref 3.8–8.4)

## 2022-10-24 ENCOUNTER — Encounter: Payer: Self-pay | Admitting: Family Medicine

## 2022-10-24 ENCOUNTER — Ambulatory Visit (INDEPENDENT_AMBULATORY_CARE_PROVIDER_SITE_OTHER): Payer: Medicare (Managed Care) | Admitting: Family Medicine

## 2022-10-24 VITALS — BP 138/88 | Ht 76.0 in | Wt 300.0 lb

## 2022-10-24 DIAGNOSIS — M79672 Pain in left foot: Secondary | ICD-10-CM | POA: Diagnosis not present

## 2022-10-24 NOTE — Progress Notes (Signed)
PCP: Pearson Grippe, MD  Subjective:   HPI: Patient is a 71 y.o. male here for left foot pain/swelling.  12/6: Patient was seen over a month ago for wrist pain/swelling. Was diagnosed with gout, did very well with prednisone dose pack and was started on allopurinol. He states since Sunday he's started to have increased pain and swelling in left great toe then across his foot. Was very difficult for great toe to touch anything at first. Has been taking percocet for pain.  12/18: Patient returns reporting his left foot pain is much better. No pain. He is taking allopurinol 100mg  daily. Finished the prednisone. No new complaints.  Past Medical History:  Diagnosis Date   Hypertension     Current Outpatient Medications on File Prior to Visit  Medication Sig Dispense Refill   allopurinol (ZYLOPRIM) 100 MG tablet Take 1 tablet (100 mg total) by mouth daily. 60 tablet 2   amLODipine (NORVASC) 10 MG tablet Take 10 mg by mouth daily.     Colloidal Oatmeal (GOLD BOND ECZEMA RELIEF) 2 % CREA Apply 1 application topically daily as needed (eczema).     metoprolol tartrate (LOPRESSOR) 50 MG tablet Take 50 mg by mouth 2 (two) times daily.     oxyCODONE-acetaminophen (PERCOCET/ROXICET) 5-325 MG tablet Take 1-2 tablets by mouth every 6 (six) hours as needed for severe pain. 10 tablet 0   predniSONE (DELTASONE) 10 MG tablet 6 tabs po day 1, 5 tabs po day 2, 4 tabs po day 3, 3 tabs po day 4, 2 tabs po day 5, 1 tab po day 6 21 tablet 0   rosuvastatin (CRESTOR) 5 MG tablet Take 5 mg by mouth daily.     sildenafil (VIAGRA) 50 MG tablet Take 50 mg by mouth as needed for erectile dysfunction.     tamsulosin (FLOMAX) 0.4 MG CAPS capsule Take 0.4 mg by mouth daily.     No current facility-administered medications on file prior to visit.    Past Surgical History:  Procedure Laterality Date   COLONOSCOPY WITH PROPOFOL N/A 07/30/2021   Procedure: COLONOSCOPY WITH PROPOFOL;  Surgeon: 08/01/2021, DO;   Location: [facility];  Service: Endoscopy;  Laterality: N/A;  12:45 / ASA II    No Known Allergies  BP 138/88   Ht 6\' 4"  (1.93 m)   Wt 300 lb (136.1 kg)   BMI 36.52 kg/m       No data to display              No data to display              Objective:  Physical Exam:  Gen: NAD, comfortable in exam room  Left foot/ankle: Minimal swelling dorsal forefoot.  No bruising, warmth, erythema. FROM ankle.  Mod limitation 1st MTP but no pain, much improved. No TTP Thompsons test negative. NV intact distally.   Assessment & Plan:  1. Left 1st MTP acute gout flare - significantly improved.  Uric acid level was 6.0.  GFR is 57.  Advised to continue allopurinol at 100mg  daily, would not increase.  Consider adding colchicine in future if he continues to have flares but he declined adding this for now.  He will follow-up with his PCP regarding his kidney function as GFR is just below 60.  F/u with Lanelle Bal prn.

## 2023-09-14 ENCOUNTER — Ambulatory Visit: Payer: Medicare (Managed Care) | Admitting: Family Medicine

## 2023-09-14 ENCOUNTER — Encounter: Payer: Self-pay | Admitting: Family Medicine

## 2023-09-14 VITALS — BP 159/100 | Ht 76.0 in | Wt 300.0 lb

## 2023-09-14 DIAGNOSIS — I10 Essential (primary) hypertension: Secondary | ICD-10-CM | POA: Diagnosis not present

## 2023-09-14 DIAGNOSIS — M546 Pain in thoracic spine: Secondary | ICD-10-CM

## 2023-09-14 DIAGNOSIS — M62830 Muscle spasm of back: Secondary | ICD-10-CM | POA: Diagnosis not present

## 2023-09-14 MED ORDER — PREDNISONE 10 MG PO TABS: ORAL_TABLET | ORAL | 0 refills | Status: AC

## 2023-09-14 MED ORDER — KETOROLAC TROMETHAMINE 60 MG/2ML IM SOLN
60.0000 mg | Freq: Once | INTRAMUSCULAR | Status: AC
  Administered 2023-09-14: 60 mg via INTRAMUSCULAR

## 2023-09-14 MED ORDER — TIZANIDINE HCL 4 MG PO TABS: 4.0000 mg | ORAL_TABLET | Freq: Every day | ORAL | 0 refills | Status: AC

## 2023-09-14 MED ORDER — METHYLPREDNISOLONE ACETATE 40 MG/ML IJ SUSP
80.0000 mg | Freq: Once | INTRAMUSCULAR | Status: AC
  Administered 2023-09-14: 80 mg via INTRAMUSCULAR

## 2023-09-14 NOTE — Assessment & Plan Note (Addendum)
HTN with elevated BP today - BP likely elevated due to him not taking BP medication this morning and having pain in his back  PLAN: - should take Amlodipine, Metoprolol when gets home - advised to f/u for PCP to check his BP as well - he expressed understanding and agreement

## 2023-09-14 NOTE — Progress Notes (Signed)
DATE OF VISIT: 09/14/2023        Richard Sport. DOB: April 25, 1951 MRN: [id number]  CC:  Upper back pain and Rt shoulder pain  History- Richard Buck. is a 72 y.o.  RHD male for evaluation and treatment of upper back pain & Rt shoulder pain Having pain x 1 week Pain mainly in upper back No radiation of pain No numbness/tingling No injury/trauma Pain throughout the day with sitting, standing, showering, getting dressed (+)night pain affecting sleep No prior issues with neck, upper back, or shoulder Has tried Percocet 5/325mg  that he had for gout - no help Has tried ibuprofen and tylenol - no help No improvement with heat or ice  Did not take BP meds yet today - usually takes in the morning  Past Medical History Past Medical History:  Diagnosis Date   Hypertension     Past Surgical History Past Surgical History:  Procedure Laterality Date   COLONOSCOPY WITH PROPOFOL N/A 07/30/2021   Procedure: COLONOSCOPY WITH PROPOFOL;  Surgeon: Lanelle Bal, DO;  Location: [facility];  Service: Endoscopy;  Laterality: N/A;  12:45 / ASA II    Medications Current Outpatient Medications  Medication Sig Dispense Refill   predniSONE (DELTASONE) 10 MG tablet Use as directed per doctors orders for the next 6 days. 21 tablet 0   tiZANidine (ZANAFLEX) 4 MG tablet Take 1 tablet (4 mg total) by mouth at bedtime. 14 tablet 0   allopurinol (ZYLOPRIM) 100 MG tablet Take 1 tablet (100 mg total) by mouth daily. 60 tablet 2   amLODipine (NORVASC) 10 MG tablet Take 10 mg by mouth daily.     Colloidal Oatmeal (GOLD BOND ECZEMA RELIEF) 2 % CREA Apply 1 application topically daily as needed (eczema).     metoprolol tartrate (LOPRESSOR) 50 MG tablet Take 50 mg by mouth 2 (two) times daily.     oxyCODONE-acetaminophen (PERCOCET/ROXICET) 5-325 MG tablet Take 1-2 tablets by mouth every 6 (six) hours as needed for severe pain. 10 tablet 0   rosuvastatin (CRESTOR) 5 MG tablet Take 5 mg by mouth daily.      sildenafil (VIAGRA) 50 MG tablet Take 50 mg by mouth as needed for erectile dysfunction.     tamsulosin (FLOMAX) 0.4 MG CAPS capsule Take 0.4 mg by mouth daily.     No current facility-administered medications for this visit.    Allergies has No Known Allergies.  Family History - reviewed per EMR and intake form  Social History   reports that he does not currently use alcohol.  reports that he has been smoking cigarettes. He has never used smokeless tobacco.  reports current drug use. Drug: Marijuana. OCCUPATION: retired   EXAM: Vitals: BP (!) 159/100   Ht 6\' 4"  (1.93 m)   Wt 300 lb (136.1 kg)   BMI 36.52 kg/m  General: AOx3, NAD, pleasant SKIN: no rashes or lesions, skin clean, dry, intact MSK: CSPINE:  FROM without pain.  no midline or paraspinal tenderness.  neg Spurling's bilaterally.  normal grip strength, normal intrinsic/extrinsic hand strength SHOULDER: b/l shoulder without gross deformity.  FROM without pain.  no TTP over bicipital groove, AC joint, greater tuberosity.  neg Hawkins, neg Neer, neg Empty Can, neg Drop arm.  RTC strength 5/5 throughout. TSPINE: (+)hypertonia of right periscapular musculature, Rt trapezius, and Rt paraspinal muscles from T3-T9.  Associated spasm in this area as well.  No midline TTP, good ROM  NEURO: sensation intact to light touch, DTR +2/4 bicep,  tricep, brachioradialis bilaterally VASC: pulses 2+ and symmetric radial artery bilaterally, no edema   Assessment & Plan Acute right-sided thoracic back pain Consistent with thoracic strain with associated muscle spasm  PLAN: - given IM Toradol 60mg  + IM Depomedrol 80mg  in office today - Rx Prednisone taper x 6 days - Rx Robaxin 750mg  po q hs prn pain/spasm.  Cautioned that can cause drowsiness so should only use at bedtime. - heating pad prn - f/u 2 weeks if worsening or no improvement or sooner prn Spasm of thoracic back muscle Consistent with thoracic strain with associated muscle  spasm  PLAN: - given IM Toradol 60mg  + IM Depomedrol 80mg  in office today - Rx Prednisone taper x 6 days - Rx Robaxin 750mg  po q hs prn pain/spasm.  Cautioned that can cause drowsiness so should only use at bedtime. - heating pad prn - f/u 2 weeks if worsening or no improvement or sooner prn Hypertension, unspecified type HTN with elevated BP today - BP likely elevated due to him not taking BP medication this morning and having pain in his back  PLAN: - should take Amlodipine, Metoprolol when gets home - advised to f/u for PCP to check his BP as well - he expressed understanding and agreement  Patient expressed understanding & agreement with above.  Encounter Diagnoses  Name Primary?   Acute right-sided thoracic back pain Yes   Spasm of thoracic back muscle    Hypertension, unspecified type     No orders of the defined types were placed in this encounter.   No orders of the defined types were placed in this encounter.

## 2023-09-14 NOTE — Patient Instructions (Addendum)
You have upper back pain due to a muscle spasm - You were given an injection of a steroid (Depomedrol) and anti-inflammatory/NSAID (Toradol) today  to help with pain an inflammation - I sent a prescription for a 6-day steroid pack to your pharmacy.  You should take this in the morning with food - I also sent a prescription for a muscle relaxant (Tizanidine) to take at bedtime - you can continue to use heat or ice as needed - you can take Tylenol 500mg  1-2 tabs every 6-8 hours as needed if you need additional pain relief (max dosage 4000mg  a day) - you should follow-up with me in 2 weeks - be sure to take your blood pressure medications when you return home and follow-up with your PCP for your elevated blood pressure

## 2023-09-28 ENCOUNTER — Ambulatory Visit: Payer: Medicare (Managed Care) | Admitting: Family Medicine
